# Patient Record
Sex: Female | Born: 1950 | Race: Black or African American | Hispanic: No | Marital: Married | Smoking: Former smoker
Health system: Southern US, Community
[De-identification: ages and names within clinical notes are randomized; demographics above are authoritative.]

## PROBLEM LIST (undated history)

## (undated) DIAGNOSIS — I10 Essential (primary) hypertension: Secondary | ICD-10-CM

## (undated) DIAGNOSIS — E119 Type 2 diabetes mellitus without complications: Secondary | ICD-10-CM

## (undated) DIAGNOSIS — K219 Gastro-esophageal reflux disease without esophagitis: Secondary | ICD-10-CM

## (undated) DIAGNOSIS — J45909 Unspecified asthma, uncomplicated: Secondary | ICD-10-CM

## (undated) DIAGNOSIS — E7800 Pure hypercholesterolemia, unspecified: Secondary | ICD-10-CM

## (undated) HISTORY — PX: OVARY SURGERY: SHX727

## (undated) HISTORY — PX: DILATION AND CURETTAGE OF UTERUS: SHX78

## (undated) HISTORY — DX: Pure hypercholesterolemia, unspecified: E78.00

---

## 2007-06-03 ENCOUNTER — Emergency Department (HOSPITAL_COMMUNITY): Admission: EM | Admit: 2007-06-03 | Discharge: 2007-06-03 | Payer: Self-pay | Admitting: Emergency Medicine

## 2013-05-31 ENCOUNTER — Other Ambulatory Visit (HOSPITAL_COMMUNITY): Payer: Self-pay | Admitting: Family Medicine

## 2013-05-31 DIAGNOSIS — Z139 Encounter for screening, unspecified: Secondary | ICD-10-CM

## 2013-05-31 DIAGNOSIS — M810 Age-related osteoporosis without current pathological fracture: Secondary | ICD-10-CM

## 2013-06-01 ENCOUNTER — Ambulatory Visit (HOSPITAL_COMMUNITY)
Admission: RE | Admit: 2013-06-01 | Discharge: 2013-06-01 | Disposition: A | Discharge status: Home / Self Care | Payer: BC Managed Care – PPO | Source: Ambulatory Visit | Attending: Family Medicine | Admitting: Family Medicine

## 2013-06-01 DIAGNOSIS — Z139 Encounter for screening, unspecified: Secondary | ICD-10-CM

## 2013-06-01 DIAGNOSIS — Z1231 Encounter for screening mammogram for malignant neoplasm of breast: Secondary | ICD-10-CM | POA: Insufficient documentation

## 2013-06-03 ENCOUNTER — Other Ambulatory Visit: Payer: Self-pay | Admitting: Family Medicine

## 2013-06-03 DIAGNOSIS — R928 Other abnormal and inconclusive findings on diagnostic imaging of breast: Secondary | ICD-10-CM

## 2013-06-08 ENCOUNTER — Ambulatory Visit (HOSPITAL_COMMUNITY)
Admission: RE | Admit: 2013-06-08 | Discharge: 2013-06-08 | Disposition: A | Discharge status: Home / Self Care | Payer: BC Managed Care – PPO | Source: Ambulatory Visit | Attending: Family Medicine | Admitting: Family Medicine

## 2013-06-08 DIAGNOSIS — M810 Age-related osteoporosis without current pathological fracture: Secondary | ICD-10-CM | POA: Insufficient documentation

## 2013-06-23 ENCOUNTER — Ambulatory Visit (HOSPITAL_COMMUNITY)
Admission: RE | Admit: 2013-06-23 | Discharge: 2013-06-23 | Disposition: A | Discharge status: Home / Self Care | Payer: BC Managed Care – PPO | Source: Ambulatory Visit | Attending: Family Medicine | Admitting: Family Medicine

## 2013-06-23 ENCOUNTER — Other Ambulatory Visit: Payer: Self-pay | Admitting: Family Medicine

## 2013-06-23 DIAGNOSIS — R928 Other abnormal and inconclusive findings on diagnostic imaging of breast: Secondary | ICD-10-CM | POA: Insufficient documentation

## 2014-02-12 ENCOUNTER — Encounter (HOSPITAL_COMMUNITY): Payer: Self-pay | Admitting: Emergency Medicine

## 2014-02-12 ENCOUNTER — Emergency Department (HOSPITAL_COMMUNITY): Payer: BC Managed Care – PPO

## 2014-02-12 ENCOUNTER — Inpatient Hospital Stay (HOSPITAL_COMMUNITY)
Admission: EM | Admit: 2014-02-12 | Discharge: 2014-02-16 | DRG: 638 | Disposition: A | Discharge status: Home / Self Care | Payer: BC Managed Care – PPO | Attending: Family Medicine | Admitting: Family Medicine

## 2014-02-12 DIAGNOSIS — E119 Type 2 diabetes mellitus without complications: Secondary | ICD-10-CM

## 2014-02-12 DIAGNOSIS — E876 Hypokalemia: Secondary | ICD-10-CM | POA: Diagnosis present

## 2014-02-12 DIAGNOSIS — Z87891 Personal history of nicotine dependence: Secondary | ICD-10-CM

## 2014-02-12 DIAGNOSIS — B370 Candidal stomatitis: Secondary | ICD-10-CM | POA: Diagnosis present

## 2014-02-12 DIAGNOSIS — Z794 Long term (current) use of insulin: Secondary | ICD-10-CM | POA: Diagnosis not present

## 2014-02-12 DIAGNOSIS — E1310 Other specified diabetes mellitus with ketoacidosis without coma: Secondary | ICD-10-CM | POA: Diagnosis not present

## 2014-02-12 DIAGNOSIS — K219 Gastro-esophageal reflux disease without esophagitis: Secondary | ICD-10-CM | POA: Diagnosis present

## 2014-02-12 DIAGNOSIS — E1110 Type 2 diabetes mellitus with ketoacidosis without coma: Secondary | ICD-10-CM | POA: Diagnosis present

## 2014-02-12 DIAGNOSIS — R531 Weakness: Secondary | ICD-10-CM | POA: Diagnosis not present

## 2014-02-12 DIAGNOSIS — E118 Type 2 diabetes mellitus with unspecified complications: Secondary | ICD-10-CM

## 2014-02-12 HISTORY — DX: Gastro-esophageal reflux disease without esophagitis: K21.9

## 2014-02-12 LAB — BASIC METABOLIC PANEL
Anion gap: 18 — ABNORMAL HIGH (ref 5–15)
Anion gap: 11 (ref 5–15)
BUN: 22 mg/dL (ref 6–23)
BUN: 19 mg/dL (ref 6–23)
BUN: 17 mg/dL (ref 6–23)
CALCIUM: 8.1 mg/dL — AB (ref 8.4–10.5)
CHLORIDE: 93 meq/L — AB (ref 96–112)
CHLORIDE: 102 meq/L (ref 96–112)
CO2: 13 mmol/L — ABNORMAL LOW (ref 19–32)
CO2: 12 mmol/L — ABNORMAL LOW (ref 19–32)
CO2: 16 mmol/L — ABNORMAL LOW (ref 19–32)
CREATININE: 1.27 mg/dL — AB (ref 0.50–1.10)
Calcium: 9.4 mg/dL (ref 8.4–10.5)
Calcium: 8.7 mg/dL (ref 8.4–10.5)
Chloride: 108 mEq/L (ref 96–112)
Creatinine, Ser: 1.09 mg/dL (ref 0.50–1.10)
Creatinine, Ser: 0.98 mg/dL (ref 0.50–1.10)
GFR calc Af Amer: 62 mL/min — ABNORMAL LOW (ref >90)
GFR calc Af Amer: 70 mL/min — ABNORMAL LOW (ref >90)
GFR calc non Af Amer: 53 mL/min — ABNORMAL LOW (ref >90)
GFR calc non Af Amer: 61 mL/min — ABNORMAL LOW (ref >90)
GFR, EST AFRICAN AMERICAN: 51 mL/min — AB (ref >90)
GFR, EST NON AFRICAN AMERICAN: 44 mL/min — AB (ref >90)
GLUCOSE: 262 mg/dL — AB (ref 70–99)
Glucose, Bld: 564 mg/dL (ref 70–99)
Glucose, Bld: 411 mg/dL — ABNORMAL HIGH (ref 70–99)
POTASSIUM: 4.5 mmol/L (ref 3.5–5.1)
POTASSIUM: 3.7 mmol/L (ref 3.5–5.1)
Potassium: 4.3 mmol/L (ref 3.5–5.1)
Sodium: 128 mmol/L — ABNORMAL LOW (ref 135–145)
Sodium: 132 mmol/L — ABNORMAL LOW (ref 135–145)
Sodium: 135 mmol/L (ref 135–145)

## 2014-02-12 LAB — CBC
HCT: 45.3 % (ref 36.0–46.0)
HCT: 42.8 % (ref 36.0–46.0)
Hemoglobin: 15.6 g/dL — ABNORMAL HIGH (ref 12.0–15.0)
Hemoglobin: 14.6 g/dL (ref 12.0–15.0)
MCH: 29.5 pg (ref 26.0–34.0)
MCH: 29.2 pg (ref 26.0–34.0)
MCHC: 34.4 g/dL (ref 30.0–36.0)
MCHC: 34.1 g/dL (ref 30.0–36.0)
MCV: 85.8 fL (ref 78.0–100.0)
MCV: 85.6 fL (ref 78.0–100.0)
PLATELETS: 169 10*3/uL (ref 150–400)
Platelets: 159 10*3/uL (ref 150–400)
RBC: 5.28 MIL/uL — ABNORMAL HIGH (ref 3.87–5.11)
RBC: 5 MIL/uL (ref 3.87–5.11)
RDW: 12.8 % (ref 11.5–15.5)
RDW: 12.8 % (ref 11.5–15.5)
WBC: 7.7 10*3/uL (ref 4.0–10.5)
WBC: 7.1 10*3/uL (ref 4.0–10.5)

## 2014-02-12 LAB — TROPONIN I
Troponin I: <0.03 ng/mL (ref <0.031)
Troponin I: <0.03 ng/mL (ref <0.031)

## 2014-02-12 LAB — CBG MONITORING, ED
GLUCOSE-CAPILLARY: 490 mg/dL — AB (ref 70–99)
Glucose-Capillary: >600 mg/dL (ref 70–99)
Glucose-Capillary: 421 mg/dL — ABNORMAL HIGH (ref 70–99)
Glucose-Capillary: 420 mg/dL — ABNORMAL HIGH (ref 70–99)

## 2014-02-12 LAB — GLUCOSE, CAPILLARY
GLUCOSE-CAPILLARY: 297 mg/dL — AB (ref 70–99)
GLUCOSE-CAPILLARY: 199 mg/dL — AB (ref 70–99)
GLUCOSE-CAPILLARY: 161 mg/dL — AB (ref 70–99)
Glucose-Capillary: 372 mg/dL — ABNORMAL HIGH (ref 70–99)
Glucose-Capillary: 320 mg/dL — ABNORMAL HIGH (ref 70–99)
Glucose-Capillary: 199 mg/dL — ABNORMAL HIGH (ref 70–99)
Glucose-Capillary: 190 mg/dL — ABNORMAL HIGH (ref 70–99)

## 2014-02-12 LAB — URINALYSIS, ROUTINE W REFLEX MICROSCOPIC
BILIRUBIN URINE: NEGATIVE
Glucose, UA: >1000 mg/dL — AB
NITRITE: NEGATIVE
PH: 5 (ref 5.0–8.0)
Protein, ur: NEGATIVE mg/dL
Specific Gravity, Urine: >1.03 — ABNORMAL HIGH (ref 1.005–1.030)
UROBILINOGEN UA: 0.2 mg/dL (ref 0.0–1.0)

## 2014-02-12 LAB — HEPATIC FUNCTION PANEL
ALBUMIN: 3.9 g/dL (ref 3.5–5.2)
ALT: 22 U/L (ref 0–35)
AST: 14 U/L (ref 0–37)
Alkaline Phosphatase: 100 U/L (ref 39–117)
BILIRUBIN DIRECT: 0.1 mg/dL (ref 0.0–0.3)
Indirect Bilirubin: 1.2 mg/dL — ABNORMAL HIGH (ref 0.3–0.9)
Total Bilirubin: 1.3 mg/dL — ABNORMAL HIGH (ref 0.3–1.2)
Total Protein: 7.9 g/dL (ref 6.0–8.3)

## 2014-02-12 LAB — URINE MICROSCOPIC-ADD ON

## 2014-02-12 LAB — MRSA PCR SCREENING: MRSA BY PCR: NEGATIVE

## 2014-02-12 LAB — LIPASE, BLOOD: LIPASE: 33 U/L (ref 11–59)

## 2014-02-12 MED ORDER — POTASSIUM CHLORIDE 10 MEQ/100ML IV SOLN
10.0000 meq | INTRAVENOUS | Status: AC
  Administered 2014-02-12 (×2): 10 meq via INTRAVENOUS
  Filled 2014-02-12 (×2): qty 100

## 2014-02-12 MED ORDER — SODIUM CHLORIDE 0.9 % IV SOLN
INTRAVENOUS | Status: DC
  Filled 2014-02-12: qty 2.5

## 2014-02-12 MED ORDER — SODIUM CHLORIDE 0.9 % IV SOLN: INTRAVENOUS | Status: AC

## 2014-02-12 MED ORDER — SODIUM CHLORIDE 0.9 % IV SOLN
1000.0000 mL | Freq: Once | INTRAVENOUS | Status: AC
  Administered 2014-02-12: 1000 mL via INTRAVENOUS

## 2014-02-12 MED ORDER — SODIUM CHLORIDE 0.9 % IV SOLN: 1000.0000 mL | INTRAVENOUS | Status: DC

## 2014-02-12 MED ORDER — PANTOPRAZOLE SODIUM 40 MG PO TBEC
40.0000 mg | DELAYED_RELEASE_TABLET | Freq: Every day | ORAL | Status: DC
  Administered 2014-02-12 – 2014-02-16 (×5): 40 mg via ORAL
  Filled 2014-02-12 (×5): qty 1

## 2014-02-12 MED ORDER — MAGIC MOUTHWASH
10.0000 mL | Freq: Three times a day (TID) | ORAL | Status: DC
  Administered 2014-02-12 – 2014-02-16 (×11): 10 mL via ORAL
  Filled 2014-02-12 (×12): qty 10

## 2014-02-12 MED ORDER — DEXTROSE-NACL 5-0.45 % IV SOLN
INTRAVENOUS | Status: DC
  Administered 2014-02-12: 19:00:00 via INTRAVENOUS

## 2014-02-12 MED ORDER — SODIUM CHLORIDE 0.9 % IV SOLN
INTRAVENOUS | Status: DC
  Administered 2014-02-12 (×2): via INTRAVENOUS

## 2014-02-12 MED ORDER — ENOXAPARIN SODIUM 40 MG/0.4ML ~~LOC~~ SOLN
40.0000 mg | SUBCUTANEOUS | Status: DC
  Administered 2014-02-12: 40 mg via SUBCUTANEOUS
  Filled 2014-02-12: qty 0.4

## 2014-02-12 MED ORDER — SODIUM CHLORIDE 0.9 % IV SOLN
INTRAVENOUS | Status: DC
  Administered 2014-02-12: 13:00:00 via INTRAVENOUS
  Filled 2014-02-12: qty 2.5

## 2014-02-12 NOTE — ED Notes (Signed)
CRITICAL VALUE ALERT  Critical value received:  Glucose 564  Date of notification:  02/02/14  Time of notification:  0881  Critical value read back:Yes.    Nurse who received alert:  Allegra Lai, RN  MD notified (1st page):  Dr Eulis Foster  Time of first page:  1020

## 2014-02-12 NOTE — ED Notes (Addendum)
Pt reports genealized weakness that has been increasingly worse, pt also reports going to her PCP earlier this week c/o hiccups and was told she has GERD and thrush. Pt reports decreased appetite but states she is still eating.

## 2014-02-12 NOTE — ED Provider Notes (Signed)
CSN: [id number]     Arrival date & time 02/12/14  3762 History   First MD Initiated Contact with Patient 02/12/14 1010     Chief Complaint  Patient presents with  . Weakness     (Consider location/radiation/quality/duration/timing/severity/associated sxs/prior Treatment) Patient is a 63 y.o. female presenting with weakness.  Weakness Associated symptoms include fatigue, headaches and nausea. Pertinent negatives include no abdominal pain, arthralgias, chest pain, chills, coughing, diaphoresis, fever, myalgias, neck pain, numbness, rash, sore throat, vomiting or weakness.    Sherry Burgess is a 63 y.o. female who presents to the Emergency Department complaining of gradual onset of generalized weakness, nausea, fatigue for 2 weeks.  She also reports having increased urination and increased thirst. Patient's husband reports decreased appetite and weight loss. Patient states she was seen by her PCP on 1220 and was given medication for thrush and acid reflux. She states these medications have not improved her symptoms. She states that she is usually a healthy very active person and she is now unable to continue her normal activities due to fatigue and weakness.  She denies any pain, fever, shortness of breath, extremity numbness or weakness or abdominal pain. Patient states that her son is a diabetic but she denies any family history of diabetes.  Nothing has made her symptoms better or worse   Past Medical History  Diagnosis Date  . GERD (gastroesophageal reflux disease)    History reviewed. No pertinent past surgical history. No family history on file. History  Substance Use Topics  . Smoking status: Former Research scientist (life sciences)  . Smokeless tobacco: Not on file  . Alcohol Use: No   OB History    No data available     Review of Systems  Constitutional: Positive for activity change, appetite change and fatigue. Negative for fever, chills and diaphoresis.  HENT: Negative for facial swelling, sore  throat and trouble swallowing.   Eyes: Negative for visual disturbance.  Respiratory: Negative for cough, chest tightness, shortness of breath and wheezing.   Cardiovascular: Negative for chest pain and palpitations.  Gastrointestinal: Positive for nausea. Negative for vomiting, abdominal pain and blood in stool.  Endocrine: Positive for polydipsia and polyuria.  Genitourinary: Negative for dysuria, hematuria and flank pain.  Musculoskeletal: Negative for myalgias, back pain, arthralgias, neck pain and neck stiffness.  Skin: Negative for rash.  Neurological: Positive for light-headedness and headaches. Negative for dizziness, syncope, speech difficulty, weakness and numbness.       Generalized weakness  Hematological: Does not bruise/bleed easily.  All other systems reviewed and are negative.     Allergies  Review of patient's allergies indicates no known allergies.  Home Medications   Prior to Admission medications   Medication Sig Start Date End Date Taking? Authorizing Provider  Alum & Mag Hydroxide-Simeth (MAGIC MOUTHWASH) SOLN Swish and spit 15-30 mLs 4 (four) times daily.   Yes Historical Provider, MD  ondansetron (ZOFRAN) 4 MG tablet Take 4 mg by mouth every 8 (eight) hours as needed for nausea or vomiting.   Yes Historical Provider, MD  pantoprazole (PROTONIX) 40 MG tablet Take 40 mg by mouth 2 (two) times daily.   Yes Historical Provider, MD   BP 183/102 mmHg  Pulse 96  Temp(Src) 98.2 F (36.8 C) (Oral)  Resp 18  Ht 5\' 7"  (1.702 m)  Wt 230 lb (104.327 kg)  BMI 36.01 kg/m2  SpO2 100% Physical Exam  Constitutional: She is oriented to person, place, and time. She appears well-developed and well-nourished. No distress.  HENT:  Head: Normocephalic and atraumatic.  Right Ear: Tympanic membrane and ear canal normal.  Left Ear: Tympanic membrane and ear canal normal.  Mouth/Throat: Uvula is midline. Mucous membranes are dry. Oral lesions present.  Few scattered white  lesions present to the soft palate and buccal mucosa  Eyes: Conjunctivae are normal. Pupils are equal, round, and reactive to light. No scleral icterus.  Neck: Normal range of motion. Neck supple.  Cardiovascular: Normal rate, regular rhythm, normal heart sounds and intact distal pulses.  Exam reveals no friction rub.   No murmur heard. Pulmonary/Chest: Effort normal and breath sounds normal. No respiratory distress. She has no wheezes. She exhibits no tenderness.  Abdominal: Soft. She exhibits no distension and no mass. There is no tenderness. There is no rebound and no guarding.  Musculoskeletal: Normal range of motion.  Neurological: She is alert and oriented to person, place, and time. She exhibits normal muscle tone. Coordination normal.  Skin: Skin is warm and dry. No rash noted.  Psychiatric: She has a normal mood and affect. Her behavior is normal. Thought content normal.  Nursing note and vitals reviewed.   ED Course  Procedures (including critical care time) Labs Review Labs Reviewed  CBC - Abnormal; Notable for the following:    RBC 5.28 (*)    Hemoglobin 15.6 (*)    All other components within normal limits  BASIC METABOLIC PANEL - Abnormal; Notable for the following:    Sodium 128 (*)    Chloride 93 (*)    CO2 13 (*)    Glucose, Bld 564 (*)    Creatinine, Ser 1.27 (*)    GFR calc non Af Amer 44 (*)    GFR calc Af Amer 51 (*)    All other components within normal limits  URINALYSIS, ROUTINE W REFLEX MICROSCOPIC - Abnormal; Notable for the following:    APPearance HAZY (*)    Specific Gravity, Urine >1.030 (*)    Glucose, UA >1000 (*)    Hgb urine dipstick MODERATE (*)    Ketones, ur >80 (*)    Leukocytes, UA TRACE (*)    All other components within normal limits  URINE MICROSCOPIC-ADD ON - Abnormal; Notable for the following:    Bacteria, UA MANY (*)    Casts GRANULAR CAST (*)    All other components within normal limits  CBG MONITORING, ED - Abnormal; Notable  for the following:    Glucose-Capillary >600 (*)    All other components within normal limits  CBG MONITORING, ED - Abnormal; Notable for the following:    Glucose-Capillary 490 (*)    All other components within normal limits  CBG MONITORING, ED - Abnormal; Notable for the following:    Glucose-Capillary 421 (*)    All other components within normal limits    Imaging Review Dg Chest Portable 1 View  02/12/2014   CLINICAL DATA:  Weakness and hyperglycemia.  EXAM: PORTABLE CHEST - 1 VIEW  COMPARISON:  06/03/2007  FINDINGS: Lungs are adequately inflated without consolidation or effusion. The cardiomediastinal silhouette and remainder of the exam is unchanged.  IMPRESSION: No active disease.   Electronically Signed   By: Marin Olp M.D.   On: 02/12/2014 11:19     Date: 02/12/2014  Rate: 88  Rhythm: normal sinus rhythm  QRS Axis: indeterminate  Intervals:   ST/T Wave abnormalities:   Conduction Disutrbances:none  Narrative Interpretation:   Old EKG Reviewed: none available  EKG read by Dr. Eulis Foster.  Rhythm normal  sinus, with baseline wander, small amt of artifact  MDM   Final diagnoses:  Diabetes mellitus, new onset  Diabetic ketoacidosis without coma associated with other specified diabetes mellitus   Pt is receiving IVF's and insulin, she remains hemodynamically stable and reports that she is feeling better after treatment.  A. gap is 22, DKA with new onset DM.     33  Consulted Dr. Roderic Palau.  Will admit pt to step down, Dr. Everette Rank service     Asuka Dusseau L. Vanessa [place], PA-C 02/12/14 [place], MD 02/12/14 [phone]

## 2014-02-12 NOTE — H&P (Signed)
[facility] History and Physical  Sherry Burgess:[id number] DOB: February 21, 1950 DOA: 02/12/2014  Referring physician: Emergency department PCP: Lanette Hampshire, MD   Chief Complaint: Generalized weakness  HPI: Sherry Burgess is a 63 y.o. female who presents to the emergency room with complaints of generalized weakness. She had seen her primary care physician earlier this week and reported repeated belching/2 cups. It was felt that she likely has GERD and she was started on a proton pump inhibitor. Further questioning of the patient today indicated that she's been having general weakness for the last several days. She's had associated polyuria, polydipsia. She has not had any vomiting, abdominal cramps, diarrhea, dysuria, fever, shortness of breath or any other complaints. She was evaluated in the emergency room were blood sugar noted to be elevated greater than 600 and she was found to be in diabetic ketoacidosis with an anion gap of 26. She is being admitted for further treatments.   Review of Systems:  Pertinent positives as per history of present illness, otherwise negative  Past Medical History  Diagnosis Date  . GERD (gastroesophageal reflux disease)    History reviewed. No pertinent past surgical history. Social History:  reports that she has quit smoking. She does not have any smokeless tobacco history on file. She reports that she does not drink alcohol. Her drug history is not on file.  No Known Allergies  Family history: Her son also has diabetes   Prior to Admission medications   Medication Sig Start Date End Date Taking? Authorizing Provider  Alum & Mag Hydroxide-Simeth (MAGIC MOUTHWASH) SOLN Swish and spit 15-30 mLs 4 (four) times daily.   Yes Historical Provider, MD  ondansetron (ZOFRAN) 4 MG tablet Take 4 mg by mouth every 8 (eight) hours as needed for nausea or vomiting.   Yes Historical Provider, MD  pantoprazole (PROTONIX) 40 MG tablet Take 40 mg by mouth 2  (two) times daily.   Yes Historical Provider, MD   Physical Exam: Filed Vitals:   02/12/14 0903 02/12/14 1317 02/12/14 1330  BP: 183/102 139/73 127/77  Pulse: 96 96 97  Temp: 98.2 F (36.8 C)    TempSrc: Oral    Resp: 18 20 24   Height: 5\' 7"  (1.702 m)    Weight: 104.327 kg (230 lb)    SpO2: 100% 100% 100%    Wt Readings from Last 3 Encounters:  02/12/14 104.327 kg (230 lb)    General:  Appears calm and comfortable Eyes: PERRL, normal lids, irises & conjunctiva ENT: Mucous membranes are dry, thrush is present in oral cavity Neck: no LAD, masses or thyromegaly Cardiovascular: RRR, no m/r/g. No LE edema. Telemetry: SR, no arrhythmias  Respiratory: CTA bilaterally, no w/r/r. Normal respiratory effort. Abdomen: soft, ntnd Skin: no rash or induration seen on limited exam Musculoskeletal: grossly normal tone BUE/BLE Psychiatric: grossly normal mood and affect, speech fluent and appropriate Neurologic: grossly non-focal.          Labs on Admission:  Basic Metabolic Panel:  Recent Labs Lab 02/12/14 0945  NA 128*  K 4.5  CL 93*  CO2 13*  GLUCOSE 564*  BUN 22  CREATININE 1.27*  CALCIUM 9.4   Liver Function Tests: No results for input(s): AST, ALT, ALKPHOS, BILITOT, PROT, ALBUMIN in the last 168 hours. No results for input(s): LIPASE, AMYLASE in the last 168 hours. No results for input(s): AMMONIA in the last 168 hours. CBC:  Recent Labs Lab 02/12/14 0945  WBC 7.7  HGB 15.6*  HCT 45.3  MCV 85.8  PLT 169   Cardiac Enzymes: No results for input(s): CKTOTAL, CKMB, CKMBINDEX, TROPONINI in the last 168 hours.  BNP (last 3 results) No results for input(s): PROBNP in the last 8760 hours. CBG:  Recent Labs Lab 02/12/14 0913 02/12/14 1202 02/12/14 1308  GLUCAP >600* 490* 421*    Radiological Exams on Admission: Dg Chest Portable 1 View  02/12/2014   CLINICAL DATA:  Weakness and hyperglycemia.  EXAM: PORTABLE CHEST - 1 VIEW  COMPARISON:  06/03/2007   FINDINGS: Lungs are adequately inflated without consolidation or effusion. The cardiomediastinal silhouette and remainder of the exam is unchanged.  IMPRESSION: No active disease.   Electronically Signed   By: Marin Olp M.D.   On: 02/12/2014 11:19    EKG: Not available for my review. This has been reordered  Assessment/Plan Active Problems:   DKA (diabetic ketoacidoses)   Thrush, oral   Diabetes mellitus   1. Diabetic ketoacidosis. Patient will be admitted to the stepdown unit. She'll receive intravenous insulin infusion per DKA protocol. Continue aggressive hydration. Once her anion gap has closed, she can be transitioned to basal/bolus insulin. Will check hemoglobin A1c. Check LFTs, lipase, cardiac enzymes, EKG to rule out any other etiology of diabetic ketoacidosis. Diabetes coordinator consult. The patient will need diabetic teaching since she is a new diabetic. 2. Oral thrush. In the setting of diabetes. Continue Magic mouthwash. 3. New onset diabetes mellitus. Treatment as above.   Code Status: full code DVT Prophylaxis: lovenox Family Communication: discussed with patient and husband at the bedside Disposition Plan: discharge home once improved  Time spent: 61mins  MEMON,JEHANZEB [facility] Pager [phone]

## 2014-02-12 NOTE — Progress Notes (Deleted)
Patient unable to void, since foley removed at 1130. Bladder scanned, showed 619 cc. Dr Roderic Palau notified by phone. Order received to place foley.

## 2014-02-13 LAB — GLUCOSE, CAPILLARY
GLUCOSE-CAPILLARY: 112 mg/dL — AB (ref 70–99)
GLUCOSE-CAPILLARY: 113 mg/dL — AB (ref 70–99)
GLUCOSE-CAPILLARY: 119 mg/dL — AB (ref 70–99)
GLUCOSE-CAPILLARY: 143 mg/dL — AB (ref 70–99)
GLUCOSE-CAPILLARY: 182 mg/dL — AB (ref 70–99)
Glucose-Capillary: 191 mg/dL — ABNORMAL HIGH (ref 70–99)
Glucose-Capillary: 143 mg/dL — ABNORMAL HIGH (ref 70–99)
Glucose-Capillary: 103 mg/dL — ABNORMAL HIGH (ref 70–99)
Glucose-Capillary: 142 mg/dL — ABNORMAL HIGH (ref 70–99)
Glucose-Capillary: 120 mg/dL — ABNORMAL HIGH (ref 70–99)
Glucose-Capillary: 325 mg/dL — ABNORMAL HIGH (ref 70–99)
Glucose-Capillary: 350 mg/dL — ABNORMAL HIGH (ref 70–99)
Glucose-Capillary: 327 mg/dL — ABNORMAL HIGH (ref 70–99)

## 2014-02-13 LAB — BASIC METABOLIC PANEL
ANION GAP: 7 (ref 5–15)
Anion gap: 5 (ref 5–15)
Anion gap: 7 (ref 5–15)
BUN: 15 mg/dL (ref 6–23)
BUN: 13 mg/dL (ref 6–23)
BUN: 12 mg/dL (ref 6–23)
CALCIUM: 8.2 mg/dL — AB (ref 8.4–10.5)
CHLORIDE: 107 meq/L (ref 96–112)
CO2: 22 mmol/L (ref 19–32)
CO2: 20 mmol/L (ref 19–32)
CO2: 21 mmol/L (ref 19–32)
CREATININE: 0.81 mg/dL (ref 0.50–1.10)
Calcium: 8.3 mg/dL — ABNORMAL LOW (ref 8.4–10.5)
Calcium: 8.3 mg/dL — ABNORMAL LOW (ref 8.4–10.5)
Chloride: 108 mEq/L (ref 96–112)
Chloride: 103 mEq/L (ref 96–112)
Creatinine, Ser: 0.8 mg/dL (ref 0.50–1.10)
Creatinine, Ser: 0.74 mg/dL (ref 0.50–1.10)
GFR calc Af Amer: 90 mL/min — ABNORMAL LOW (ref >90)
GFR calc Af Amer: >90 mL/min (ref >90)
GFR calc Af Amer: 88 mL/min — ABNORMAL LOW (ref >90)
GFR, EST NON AFRICAN AMERICAN: 77 mL/min — AB (ref >90)
GFR, EST NON AFRICAN AMERICAN: 89 mL/min — AB (ref >90)
GFR, EST NON AFRICAN AMERICAN: 76 mL/min — AB (ref >90)
GLUCOSE: 187 mg/dL — AB (ref 70–99)
GLUCOSE: 156 mg/dL — AB (ref 70–99)
GLUCOSE: 310 mg/dL — AB (ref 70–99)
POTASSIUM: 3.6 mmol/L (ref 3.5–5.1)
POTASSIUM: 2.9 mmol/L — AB (ref 3.5–5.1)
Potassium: 3.7 mmol/L (ref 3.5–5.1)
Sodium: 135 mmol/L (ref 135–145)
Sodium: 134 mmol/L — ABNORMAL LOW (ref 135–145)
Sodium: 131 mmol/L — ABNORMAL LOW (ref 135–145)

## 2014-02-13 LAB — TROPONIN I
Troponin I: <0.03 ng/mL (ref <0.031)
Troponin I: <0.03 ng/mL (ref <0.031)

## 2014-02-13 LAB — HEMOGLOBIN A1C
Hgb A1c MFr Bld: 14.1 % — ABNORMAL HIGH (ref <5.7)
MEAN PLASMA GLUCOSE: 358 mg/dL — AB (ref <117)

## 2014-02-13 MED ORDER — INSULIN ASPART 100 UNIT/ML ~~LOC~~ SOLN
0.0000 [IU] | Freq: Three times a day (TID) | SUBCUTANEOUS | Status: DC
  Administered 2014-02-13 – 2014-02-14 (×3): 11 [IU] via SUBCUTANEOUS

## 2014-02-13 MED ORDER — ENOXAPARIN SODIUM 40 MG/0.4ML ~~LOC~~ SOLN
40.0000 mg | SUBCUTANEOUS | Status: DC
  Administered 2014-02-13 – 2014-02-16 (×4): 40 mg via SUBCUTANEOUS
  Filled 2014-02-13 (×4): qty 0.4

## 2014-02-13 MED ORDER — SODIUM CHLORIDE 0.9 % IV SOLN
INTRAVENOUS | Status: DC
Start: 2014-02-13 — End: 2014-02-16
  Administered 2014-02-13 – 2014-02-15 (×5): via INTRAVENOUS

## 2014-02-13 MED ORDER — INSULIN ASPART 100 UNIT/ML ~~LOC~~ SOLN
0.0000 [IU] | Freq: Every day | SUBCUTANEOUS | Status: DC
  Administered 2014-02-14: 4 [IU] via SUBCUTANEOUS

## 2014-02-13 MED ORDER — LIVING WELL WITH DIABETES BOOK
Freq: Once | Status: DC
  Filled 2014-02-13: qty 1

## 2014-02-13 MED ORDER — POTASSIUM CHLORIDE 10 MEQ/100ML IV SOLN
10.0000 meq | INTRAVENOUS | Status: AC
  Administered 2014-02-13 (×3): 10 meq via INTRAVENOUS
  Filled 2014-02-13 (×3): qty 100

## 2014-02-13 MED ORDER — POTASSIUM CHLORIDE CRYS ER 20 MEQ PO TBCR
40.0000 meq | EXTENDED_RELEASE_TABLET | Freq: Once | ORAL | Status: AC
  Administered 2014-02-13: 40 meq via ORAL
  Filled 2014-02-13: qty 2

## 2014-02-13 NOTE — Progress Notes (Signed)
Subjective: The patient is alert and oriented this morning. She was admitted with diabetic ketoacidosis with blood sugars greater than 600.  Objective: Vital signs in last 24 hours: Temp:  [97.5 F (36.4 C)-98.3 F (36.8 C)] 98.3 F (36.8 C) (12/27 0400) Pulse Rate:  [72-97] 72 (12/27 0700) Resp:  [13-24] 14 (12/27 0700) BP: (115-183)/(59-128) 148/75 mmHg (12/27 0700) SpO2:  [98 %-100 %] 100 % (12/27 0700) Weight:  [104 kg (229 lb 4.5 oz)-104.327 kg (230 lb)] 104 kg (229 lb 4.5 oz) (12/27 0500) Weight change:     Intake/Output from previous day: 12/26 0701 - 12/27 0700 In: 1616.6 [I.V.:1416.6; IV Piggyback:200] Out: -  Intake/Output this shift:    Physical Exam: General appearance patient is alert and oriented  HEENT negative  Neck supple no JVD or thyroid abnormalities  Lungs clear to P&A  Heart regular rhythm no murmurs  Abdomen soft no palpable organs or masses  Skin warm and dry   Recent Labs  02/12/14 0945 02/12/14 1400  WBC 7.7 7.1  HGB 15.6* 14.6  HCT 45.3 42.8  PLT 169 159   BMET  Recent Labs  02/12/14 2250 02/13/14 0153  NA 135 134*  K 3.6 2.9*  CL 108 107  CO2 22 20  GLUCOSE 187* 156*  BUN 15 13  CREATININE 0.80 0.74  CALCIUM 8.3* 8.3*    Studies/Results: Dg Chest Portable 1 View  02/12/2014   CLINICAL DATA:  Weakness and hyperglycemia.  EXAM: PORTABLE CHEST - 1 VIEW  COMPARISON:  06/03/2007  FINDINGS: Lungs are adequately inflated without consolidation or effusion. The cardiomediastinal silhouette and remainder of the exam is unchanged.  IMPRESSION: No active disease.   Electronically Signed   By: Marin Olp M.D.   On: 02/12/2014 11:19    Medications:  . enoxaparin (LOVENOX) injection  40 mg Subcutaneous Q24H  . magic mouthwash  10 mL Oral TID  . pantoprazole  40 mg Oral Daily    . sodium chloride 125 mL/hr at 02/12/14 1851  . dextrose 5 % and 0.45% NaCl 125 mL/hr at 02/13/14 0300  . insulin (NOVOLIN-R) infusion 4.2 Units/hr  (02/13/14 0300)     Assessment/Plan: New-onset diabetes with ketoacidosis-plan at this point to continue D5 half-normal saline-start sliding scale insulin and Accu-Cheks before meals and at bedtime. Also because of low serum potassium patient will receive runs of potassium and by mouth potassium. We will recheck chemistries and continue to monitor.   LOS: 1 day   Rissie Sculley G 02/13/2014, 7:29 AM

## 2014-02-13 NOTE — Progress Notes (Signed)
Pt educated in regards to carb counting and use of the patient education videos. She is currently watching the education material, and any questions will be addressed.  Importance of foot care was also relayed.

## 2014-02-14 LAB — GLUCOSE, CAPILLARY
GLUCOSE-CAPILLARY: 320 mg/dL — AB (ref 70–99)
GLUCOSE-CAPILLARY: 392 mg/dL — AB (ref 70–99)
Glucose-Capillary: 255 mg/dL — ABNORMAL HIGH (ref 70–99)
Glucose-Capillary: 281 mg/dL — ABNORMAL HIGH (ref 70–99)

## 2014-02-14 LAB — BASIC METABOLIC PANEL
ANION GAP: 8 (ref 5–15)
BUN: 8 mg/dL (ref 6–23)
CALCIUM: 8.4 mg/dL (ref 8.4–10.5)
CO2: 22 mmol/L (ref 19–32)
Chloride: 101 mEq/L (ref 96–112)
Creatinine, Ser: 0.77 mg/dL (ref 0.50–1.10)
GFR calc Af Amer: >90 mL/min (ref >90)
GFR calc non Af Amer: 88 mL/min — ABNORMAL LOW (ref >90)
Glucose, Bld: 426 mg/dL — ABNORMAL HIGH (ref 70–99)
POTASSIUM: 3.1 mmol/L — AB (ref 3.5–5.1)
SODIUM: 131 mmol/L — AB (ref 135–145)

## 2014-02-14 LAB — TROPONIN I
Troponin I: <0.03 ng/mL (ref <0.031)
Troponin I: <0.03 ng/mL (ref <0.031)
Troponin I: <0.03 ng/mL (ref <0.031)

## 2014-02-14 MED ORDER — INSULIN STARTER KIT- SYRINGES (ENGLISH)
1.0000 | Freq: Once | Status: AC
  Administered 2014-02-14: 1
  Filled 2014-02-14: qty 1

## 2014-02-14 MED ORDER — INSULIN DETEMIR 100 UNIT/ML ~~LOC~~ SOLN
30.0000 [IU] | Freq: Every day | SUBCUTANEOUS | Status: DC
  Filled 2014-02-14: qty 0.3

## 2014-02-14 MED ORDER — POTASSIUM CHLORIDE 10 MEQ/100ML IV SOLN
10.0000 meq | INTRAVENOUS | Status: AC
  Administered 2014-02-14 (×3): 10 meq via INTRAVENOUS
  Filled 2014-02-14 (×3): qty 100

## 2014-02-14 MED ORDER — INSULIN ASPART 100 UNIT/ML ~~LOC~~ SOLN
0.0000 [IU] | Freq: Three times a day (TID) | SUBCUTANEOUS | Status: DC
  Administered 2014-02-14: 20 [IU] via SUBCUTANEOUS
  Administered 2014-02-14: 11 [IU] via SUBCUTANEOUS
  Administered 2014-02-15: 7 [IU] via SUBCUTANEOUS
  Administered 2014-02-15: 20 [IU] via SUBCUTANEOUS
  Administered 2014-02-15: 7 [IU] via SUBCUTANEOUS
  Administered 2014-02-16: 20 [IU] via SUBCUTANEOUS

## 2014-02-14 MED ORDER — INSULIN DETEMIR 100 UNIT/ML ~~LOC~~ SOLN
10.0000 [IU] | Freq: Once | SUBCUTANEOUS | Status: AC
  Administered 2014-02-14: 10 [IU] via SUBCUTANEOUS
  Filled 2014-02-14: qty 0.1

## 2014-02-14 MED ORDER — INSULIN ASPART 100 UNIT/ML ~~LOC~~ SOLN
5.0000 [IU] | Freq: Three times a day (TID) | SUBCUTANEOUS | Status: DC
  Administered 2014-02-14 (×2): 5 [IU] via SUBCUTANEOUS

## 2014-02-14 MED ORDER — INSULIN ASPART 100 UNIT/ML ~~LOC~~ SOLN
0.0000 [IU] | Freq: Every day | SUBCUTANEOUS | Status: DC
  Administered 2014-02-14 – 2014-02-15 (×2): 3 [IU] via SUBCUTANEOUS

## 2014-02-14 MED ORDER — INSULIN DETEMIR 100 UNIT/ML ~~LOC~~ SOLN
20.0000 [IU] | Freq: Every day | SUBCUTANEOUS | Status: DC
  Administered 2014-02-14: 20 [IU] via SUBCUTANEOUS
  Filled 2014-02-14: qty 0.2

## 2014-02-14 NOTE — Care Management Note (Signed)
    Page 1 of 1   02/14/2014     2:49:01 PM CARE MANAGEMENT NOTE 02/14/2014  Patient:  Sherry Burgess, Sherry Burgess   Account Number:  [id number]  Date Initiated:  02/14/2014  Documentation initiated by:  Jolene Provost  Subjective/Objective Assessment:   Pt is from home with husband and children. Pt admitted for DKA. Pt has no HH serivces, DME's or med needs prior to admission. Pt plans to discharge home with self care. No CM needs identified at this time.     Action/Plan:   Anticipated DC Date:  02/16/2014   Anticipated DC Plan:  [place]  CM consult      Choice offered to / List presented to:             Status of service:  Completed, signed off Medicare Important Message given?   (If response is "NO", the following Medicare IM given date fields will be blank) Date Medicare IM given:   Medicare IM given by:   Date Additional Medicare IM given:   Additional Medicare IM given by:    Discharge Disposition:  HOME/SELF CARE  Per UR Regulation:  Reviewed for med. necessity/level of care/duration of stay  If discussed at [place] of Stay Meetings, dates discussed:    Comments:  02/14/2014 [place], RN, MSN, [facility]

## 2014-02-14 NOTE — Progress Notes (Signed)
Inpatient Diabetes Program Recommendations  AACE/ADA: New Consensus Statement on Inpatient Glycemic Control (2013)  Target Ranges:  Prepandial:   less than 140 mg/dL      Peak postprandial:   less than 180 mg/dL (1-2 hours)      Critically ill patients:  140 - 180 mg/dL   Results for Sherry Burgess, Sherry Burgess (MRN [id number]) as of 02/14/2014 10:00  Ref. Range 02/13/2014 08:33 02/13/2014 11:21 02/13/2014 16:37 02/13/2014 21:24 02/14/2014 07:54  Glucose-Capillary Latest Range: 70-99 mg/dL 120 (H) 325 (H) 350 (H) 327 (H) 320 (H)   Diabetes history: Newly diagnosed this admission Outpatient Diabetes medications: NA Current orders for Inpatient glycemic control: Levemir 20 units daily, Novolog 0-15 units AC, Novolog 0-5 units HS  Inpatient Diabetes Program Recommendations Insulin - Basal: Noted patient was on an insulin drip and was not given any basal insulin at time of transition. As a result, fasting glucose up to 320 mg/dl this morning. Levemir 20 units was ordered this morning and has already been given. Please consider increasing Levemir to 30 units daily (based on 94 kg x 0.3 units).  Correction (SSI): Please consider increasing Novolog correction to resistant scale. Insulin - Meal Coverage: Please consider ordering Novolog 5 units TID with meals for meal coverage. HgbA1C: A1C 14.1% on 02/12/14.  Thanks, Barnie Alderman, RN, MSN, CCRN, CDE Diabetes Coordinator Inpatient Diabetes Program [phone] (Team Pager) [phone] ([facility] office) [phone] [facility] office)

## 2014-02-14 NOTE — Plan of Care (Signed)
Problem: Phase II Progression Outcomes Goal: CBGs stable on SQ insulin Outcome: Progressing CBGs in 300s today; Levemir started and meal coverage added    Goal: Progress activity as tolerated unless otherwise ordered Outcome: Progressing Pt up to chair; independently

## 2014-02-14 NOTE — Progress Notes (Signed)
Subjective: The patient is alert and oriented this morning. She was admitted with diabetic ketoacidosis.  Objective: Vital signs in last 24 hours: Temp:  [98.1 F (36.7 C)-98.5 F (36.9 C)] 98.3 F (36.8 C) (12/28 0400) Pulse Rate:  [72-95] 75 (12/28 0600) Resp:  [9-23] 13 (12/28 0600) BP: (92-149)/(57-102) 127/57 mmHg (12/28 0600) SpO2:  [95 %-100 %] 100 % (12/28 0600) Weight:  [94.9 kg (209 lb 3.5 oz)] 94.9 kg (209 lb 3.5 oz) (12/28 0500) Weight change: -9.427 kg (-20 lb 12.5 oz) Last BM Date: 02/09/14  Intake/Output from previous day: 12/27 0701 - 12/28 0700 In: 3260 [P.O.:960; I.V.:2000; IV Piggyback:300] Out: -  Intake/Output this shift:    Physical Exam: General appearance patient is alert and oriented  HEENT negative  Neck supple no JVD or thyroid abnormalities  Lungs clear to P&A  Heart regular rhythm no murmurs  Abdomen soft no palpable organs or masses  Skin warm and dry   Recent Labs  02/12/14 0945 02/12/14 1400  WBC 7.7 7.1  HGB 15.6* 14.6  HCT 45.3 42.8  PLT 169 159   BMET  Recent Labs  02/13/14 0153 02/13/14 1041  NA 134* 131*  K 2.9* 3.7  CL 107 103  CO2 20 21  GLUCOSE 156* 310*  BUN 13 12  CREATININE 0.74 0.81  CALCIUM 8.3* 8.2*    Studies/Results: Dg Chest Portable 1 View  02/12/2014   CLINICAL DATA:  Weakness and hyperglycemia.  EXAM: PORTABLE CHEST - 1 VIEW  COMPARISON:  06/03/2007  FINDINGS: Lungs are adequately inflated without consolidation or effusion. The cardiomediastinal silhouette and remainder of the exam is unchanged.  IMPRESSION: No active disease.   Electronically Signed   By: Marin Olp M.D.   On: 02/12/2014 11:19    Medications:  . enoxaparin (LOVENOX) injection  40 mg Subcutaneous Q24H  . insulin aspart  0-15 Units Subcutaneous TID WC  . insulin aspart  0-5 Units Subcutaneous QHS  . living well with diabetes book   Does not apply Once  . magic mouthwash  10 mL Oral TID  . pantoprazole  40 mg Oral Daily     . sodium chloride 125 mL/hr at 02/13/14 1900     Assessment/Plan: 1. New onset diabetes with diabetic ketoacidosis-patient is improving. Her chemistries have improved but she still has blood sugars in the 300 range. Plan to start long-acting insulin to continue sliding scale short-acting insulin. We will continue to check to check chemistries and continue to monitor sugars. She could move out of intensive care unit when bed is available. We will continue diabetic teaching   LOS: 2 days   Sherry Burgess G 02/14/2014, 6:40 AM

## 2014-02-14 NOTE — Care Management Utilization Note (Signed)
UR complete 

## 2014-02-15 LAB — GLUCOSE, CAPILLARY
GLUCOSE-CAPILLARY: 275 mg/dL — AB (ref 70–99)
Glucose-Capillary: 239 mg/dL — ABNORMAL HIGH (ref 70–99)
Glucose-Capillary: 389 mg/dL — ABNORMAL HIGH (ref 70–99)
Glucose-Capillary: 215 mg/dL — ABNORMAL HIGH (ref 70–99)

## 2014-02-15 LAB — TROPONIN I
Troponin I: <0.03 ng/mL (ref <0.031)
Troponin I: <0.03 ng/mL (ref <0.031)

## 2014-02-15 LAB — BASIC METABOLIC PANEL
Anion gap: 6 (ref 5–15)
BUN: 7 mg/dL (ref 6–23)
CHLORIDE: 103 meq/L (ref 96–112)
CO2: 27 mmol/L (ref 19–32)
CREATININE: 0.63 mg/dL (ref 0.50–1.10)
Calcium: 8.2 mg/dL — ABNORMAL LOW (ref 8.4–10.5)
GFR calc Af Amer: >90 mL/min (ref >90)
GFR calc non Af Amer: >90 mL/min (ref >90)
Glucose, Bld: 247 mg/dL — ABNORMAL HIGH (ref 70–99)
Potassium: 3.3 mmol/L — ABNORMAL LOW (ref 3.5–5.1)
Sodium: 136 mmol/L (ref 135–145)

## 2014-02-15 MED ORDER — POTASSIUM CHLORIDE CRYS ER 20 MEQ PO TBCR
20.0000 meq | EXTENDED_RELEASE_TABLET | Freq: Every day | ORAL | Status: DC
  Administered 2014-02-15 – 2014-02-16 (×2): 20 meq via ORAL
  Filled 2014-02-15 (×2): qty 1

## 2014-02-15 MED ORDER — INSULIN DETEMIR 100 UNIT/ML ~~LOC~~ SOLN
35.0000 [IU] | Freq: Every day | SUBCUTANEOUS | Status: DC
  Administered 2014-02-15 – 2014-02-16 (×2): 35 [IU] via SUBCUTANEOUS
  Filled 2014-02-15 (×7): qty 0.35

## 2014-02-15 MED ORDER — DOCUSATE SODIUM 100 MG PO CAPS
100.0000 mg | ORAL_CAPSULE | Freq: Two times a day (BID) | ORAL | Status: DC
  Administered 2014-02-15 – 2014-02-16 (×3): 100 mg via ORAL
  Filled 2014-02-15 (×3): qty 1

## 2014-02-15 MED ORDER — INSULIN ASPART 100 UNIT/ML ~~LOC~~ SOLN
10.0000 [IU] | Freq: Three times a day (TID) | SUBCUTANEOUS | Status: DC
  Administered 2014-02-15 – 2014-02-16 (×3): 10 [IU] via SUBCUTANEOUS

## 2014-02-15 NOTE — Progress Notes (Signed)
Nutrition Brief Note  Patient identified on the Malnutrition Screening Tool (MST) Report and  RD consulted for nutrition education regarding diabetes.    Wt Readings from Last 15 Encounters:  02/14/14 209 lb 3.5 oz (94.9 kg)    Body mass index is 32.76 kg/(m^2). Patient meets criteria for obesity class I based on current BMI. She is newly diagnosed with diabetes and any weight loss she may have experienced likely related to same.   Lab Results  Component Value Date   HGBA1C 14.1* 02/12/2014    RD provided "Carbohydrate Counting for People with Diabetes", Plate method, Healthy Grocery shopping list handouts in addition to Booklets already provided by diabetes coordinator team. Discussed different food groups and their effects on blood sugar, emphasizing carbohydrate-containing foods. Provided list of carbohydrates and recommended serving sizes of common foods. Reviewed label reading and practiced cho counting with foods she usually consumes.  Emphasized importance of controlled and consistent carbohydrate intake throughout the day. Provided examples of ways to balance meals/snacks and encouraged intake of high-fiber, whole grain complex carbohydrates. Teach back method used.  Expect good compliance. Pt has good family support and is motivated to make the necessary dietary changes to improve her blood glucose control.  Current diet order is CHO Modifed, patient is consuming approximately 100% of meals at this time. Labs and medications reviewed.   Colman Cater MS,RD,CSG,LDN Office: [phone] Pager: [phone]

## 2014-02-15 NOTE — Progress Notes (Signed)
Subjective: The patient is alert and oriented this morning. She was admitted with diabetic ketoacidosis. She has improved her blood sugars are in the 200 range and she is feeling better.  Objective: Vital signs in last 24 hours: Temp:  [98.2 F (36.8 C)-98.6 F (37 C)] 98.4 F (36.9 C) (12/29 0436) Pulse Rate:  [73-105] 73 (12/29 0436) Resp:  [13-20] 18 (12/29 0436) BP: (113-135)/(60-99) 132/73 mmHg (12/29 0436) SpO2:  [99 %-100 %] 100 % (12/29 0436) Weight change:  Last BM Date: 02/14/14  Intake/Output from previous day:   Intake/Output this shift:    Physical Exam: Gen. appearance-the patient is alert and oriented  HEENT negative  Neck supple no JVD or thyroid abnormalities  Lungs clear to P&A  Heart regular rhythm no murmurs  Abdomen soft no palpable organs or masses  Skin warm and dry   Recent Labs  02/12/14 0945 02/12/14 1400  WBC 7.7 7.1  HGB 15.6* 14.6  HCT 45.3 42.8  PLT 169 159   BMET  Recent Labs  02/14/14 0939 02/15/14 0228  NA 131* 136  K 3.1* 3.3*  CL 101 103  CO2 22 27  GLUCOSE 426* 247*  BUN 8 7  CREATININE 0.77 0.63  CALCIUM 8.4 8.2*    Studies/Results: No results found.  Medications:  . enoxaparin (LOVENOX) injection  40 mg Subcutaneous Q24H  . insulin aspart  0-20 Units Subcutaneous TID WC  . insulin aspart  0-5 Units Subcutaneous QHS  . insulin aspart  5 Units Subcutaneous TID WC  . insulin detemir  30 Units Subcutaneous Daily  . living well with diabetes book   Does not apply Once  . magic mouthwash  10 mL Oral TID  . pantoprazole  40 mg Oral Daily    . sodium chloride 100 mL/hr at 02/15/14 3244     Assessment/Plan: 1. New onset diabetes with  diabetic ketoacidosis. Her blood sugars are in the 200 range now. She's been started on long-acting insulin along with short acting insulin. She did receive IV potassium yesterday. Her current blood sugars 247 and potassium 3.3. We will continue potassium repletion and diabetic  teaching.   LOS: 3 days   Lendora Keys G 02/15/2014, 6:23 AM

## 2014-02-15 NOTE — Progress Notes (Signed)
Spoke with patient and her daughter about new diabetes diagnosis. Discussed A1C results (14.1% on 02/12/14) and explained what an A1C is, basic pathophysiology of DM Type 2, basic home care, importance of checking CBGs and maintaining good CBG control to prevent long-term and short-term complications. Reviewed signs and symptoms of hyperglycemia and hypoglycemia along with treatment for both. Reviewed Living Well with Diabetes in detail. Discussed impact of nutrition, exercise, stress, sickness, and medications on diabetes control.  Discussed carbohydrates, carbohydrate goals per day and meal, along with portion sizes. Provided verbal and written information on free outpatient diabetes education class at [facility] and encouraged patient to attend one of the classes.   Have ordered educational booklet, insulin starter kit, and DM videos. Educated patient on insulin injections with vial/syringe and insulin pen. Reviewed contents of insulin flexpen starter kit. Reviewed all steps of insulin pen including attachment of needle, 2-unit air shot, dialing up dose, giving injection, removing needle, disposal of sharps, storage of unused insulin, disposal of insulin etc. Patient able to provide successful return demonstration. Also reviewed all steps for insulin administration with vial and syringe and patient was able to successfully return demonstration of proper technique. Patient reports that she has self administered several insulin shots as an inpatient and she is feeling more comfortable with giving herself insulin injections. Patient verbalized understanding of information discussed and she states that she has no further questions at this time related to diabetes. RNs to provide ongoing basic DM education at bedside with this patient and engage patient to actively check blood glucose and administer insulin injections.  At time of discharge, MD to provide patient with Rxs for glucometer, test strips,  lancets, insulin pens, and insulin pen needles.  Thanks, Barnie Alderman, RN, MSN, CCRN Diabetes Coordinator Inpatient Diabetes Program [phone] (Team Pager) [phone] ([facility] office) [phone] [place] office)

## 2014-02-15 NOTE — Progress Notes (Signed)
Inpatient Diabetes Program Recommendations  AACE/ADA: New Consensus Statement on Inpatient Glycemic Control (2013)  Target Ranges:  Prepandial:   less than 140 mg/dL      Peak postprandial:   less than 180 mg/dL (1-2 hours)      Critically ill patients:  140 - 180 mg/dL   Results for Sherry Burgess, Sherry Burgess (MRN [id number]) as of 02/15/2014 08:27  Ref. Range 02/14/2014 07:54 02/14/2014 11:40 02/14/2014 16:27 02/14/2014 20:26 02/15/2014 08:00  Glucose-Capillary Latest Range: 70-99 mg/dL 320 (H) 392 (H) 255 (H) 281 (H) 239 (H)   Diabetes history: Newly diagnosed this admission Outpatient Diabetes medications: NA Current orders for Inpatient glycemic control: Levemir 30 units daily, Novolog 0-20 units AC, Novolog 0-5 units HS, Novolog 5 units TID with meals for meal coverage  Inpatient Diabetes Program Recommendations Insulin - Basal: Please consider increasing Levemir to 35 units daily. Insulin - Meal Coverage: Please consider increasing meal coverage to Novolog 10 units TID with meals. HgbA1C: A1C 14.1% on 02/12/14.  Thanks, Barnie Alderman, RN, MSN, CCRN, CDE Diabetes Coordinator Inpatient Diabetes Program [phone] (Team Pager) [phone] ([facility] office) [phone] [facility] office)

## 2014-02-15 NOTE — Progress Notes (Addendum)
Pt does not want to get stuck by lab for troponins any more. She states that the doctor said her heart was fine and that she would be leaving in the morning and she does not think she needs to be stuck anymore. Pt has had 11 troponins drawn since admission. Each has been negative.

## 2014-02-16 LAB — GLUCOSE, CAPILLARY: GLUCOSE-CAPILLARY: 356 mg/dL — AB (ref 70–99)

## 2014-02-16 MED ORDER — POTASSIUM CHLORIDE CRYS ER 20 MEQ PO TBCR: 20.0000 meq | EXTENDED_RELEASE_TABLET | Freq: Every day | ORAL | Status: DC

## 2014-02-16 NOTE — Discharge Summary (Signed)
Physician Discharge Summary  Sherry Burgess KNL:[id number] DOB: [birth year] DOA: 02/12/2014  PCP: Lanette Hampshire, MD  Admit date: 02/12/2014 Discharge date: 02/16/2014     Discharge Diagnoses:  1. New onset diabetes with diabetic ketoacidosis 2. Low serum potassium level hypokalemia 3.   Discharge Condition: Stable Disposition: Home   Diet recommendation: 2000-calorie ADA diet   Filed Weights   02/12/14 0903 02/13/14 0500 02/14/14 0500  Weight: 104.327 kg (230 lb) 104 kg (229 lb 4.5 oz) 94.9 kg (209 lb 3.5 oz)    History of present illness:  The patient presented to the emergency room with generalized weakness. Is felt she had gastroesophageal reflux her blood sugar was found to be extremely high greater than 600 and she is found to be in diabetic ketoacidosis  Hospital Course:  The patient was admitted to ICU after being seen in ED and started on IV insulin infusion per DKA protocol. Aggressive hydration was started. Her sugars gradually decreased and she was able to be started on basal insulin Levemir along with sliding scale Novolin R. She did have low serum potassium which was repleted. She did receive a diabetic teaching and progressively improved. She moved out of ICU to regular bed on December 28 and continued to progressively improved. She continued medications listed below  Discharge Instructions The patient was instructed to continue Levemir 35 units daily each a.m. and to continue sliding scale Humalog insulin before meals and at bedtime. I instructed the patient to have family member pick up these prescriptions at my office today also will give prescription for Accu-Chek monitor and strips. She will be given appointment to primary care care physician's office in one week.    Medication List    STOP taking these medications        ondansetron 4 MG tablet  Commonly known as:  ZOFRAN      TAKE these medications        magic mouthwash Soln  Swish and spit  15-30 mLs 4 (four) times daily.     pantoprazole 40 MG tablet  Commonly known as:  PROTONIX  Take 40 mg by mouth 2 (two) times daily.     potassium chloride SA 20 MEQ tablet  Commonly known as:  K-DUR,KLOR-CON  Take 1 tablet (20 mEq total) by mouth daily.       No Known Allergies  The results of significant diagnostics from this hospitalization (including imaging, microbiology, ancillary and laboratory) are listed below for reference.    Significant Diagnostic Studies: Dg Chest Portable 1 View  02/12/2014   CLINICAL DATA:  Weakness and hyperglycemia.  EXAM: PORTABLE CHEST - 1 VIEW  COMPARISON:  06/03/2007  FINDINGS: Lungs are adequately inflated without consolidation or effusion. The cardiomediastinal silhouette and remainder of the exam is unchanged.  IMPRESSION: No active disease.   Electronically Signed   By: Marin Olp M.D.   On: 02/12/2014 11:19    Microbiology: Recent Results (from the past 240 hour(s))  MRSA PCR Screening     Status: None   Collection Time: 02/12/14  3:10 PM  Result Value Ref Range Status   MRSA by PCR NEGATIVE NEGATIVE Final    Comment:        The GeneXpert MRSA Assay (FDA approved for NASAL specimens only), is one component of a comprehensive MRSA colonization surveillance program. It is not intended to diagnose MRSA infection nor to guide or monitor treatment for MRSA infections.      Labs: Basic Metabolic Panel:  Recent  Labs Lab 02/12/14 2250 02/13/14 0153 02/13/14 1041 02/14/14 0939 02/15/14 0228  NA 135 134* 131* 131* 136  K 3.6 2.9* 3.7 3.1* 3.3*  CL 108 107 103 101 103  CO2 22 20 21 22 27   GLUCOSE 187* 156* 310* 426* 247*  BUN 15 13 12 8 7   CREATININE 0.80 0.74 0.81 0.77 0.63  CALCIUM 8.3* 8.3* 8.2* 8.4 8.2*   Liver Function Tests:  Recent Labs Lab 02/12/14 1359  AST 14  ALT 22  ALKPHOS 100  BILITOT 1.3*  PROT 7.9  ALBUMIN 3.9    Recent Labs Lab 02/12/14 1359  LIPASE 33   No results for input(s): AMMONIA  in the last 168 hours. CBC:  Recent Labs Lab 02/12/14 0945 02/12/14 1400  WBC 7.7 7.1  HGB 15.6* 14.6  HCT 45.3 42.8  MCV 85.8 85.6  PLT 169 159   Cardiac Enzymes:  Recent Labs Lab 02/14/14 1501 02/14/14 1944 02/15/14 0228 02/15/14 0724 02/15/14 1418  TROPONINI <0.03 <0.03 <0.03 <0.03 <0.03   BNP: BNP (last 3 results) No results for input(s): PROBNP in the last 8760 hours. CBG:  Recent Labs Lab 02/14/14 2026 02/15/14 0800 02/15/14 1111 02/15/14 1719 02/15/14 2012  GLUCAP 281* 239* 389* 215* 275*    Active Problems:   DKA (diabetic ketoacidoses)   Thrush, oral   Diabetes mellitus   Time coordinating discharge: 45 minutes  Signed:  Marjean Donna, MD 02/16/2014, 6:34 AM

## 2014-02-16 NOTE — Care Management Utilization Note (Signed)
UR complete 

## 2014-02-16 NOTE — Progress Notes (Signed)
Patient discharged home today with instructions given on medications,and follow up visits,patient verbalized understanding. Prescription sent with patient. Accompanied by staff to an awaiting vehicle.

## 2015-04-10 ENCOUNTER — Other Ambulatory Visit (HOSPITAL_COMMUNITY)
Admission: RE | Admit: 2015-04-10 | Discharge: 2015-04-10 | Disposition: A | Discharge status: Home / Self Care | Payer: BLUE CROSS/BLUE SHIELD | Source: Ambulatory Visit | Attending: Internal Medicine | Admitting: Internal Medicine

## 2015-04-10 DIAGNOSIS — I10 Essential (primary) hypertension: Secondary | ICD-10-CM | POA: Insufficient documentation

## 2015-04-10 DIAGNOSIS — E118 Type 2 diabetes mellitus with unspecified complications: Secondary | ICD-10-CM | POA: Diagnosis present

## 2015-04-10 LAB — URINALYSIS, ROUTINE W REFLEX MICROSCOPIC
Bilirubin Urine: NEGATIVE
GLUCOSE, UA: NEGATIVE mg/dL
KETONES UR: NEGATIVE mg/dL
Nitrite: NEGATIVE
PROTEIN: NEGATIVE mg/dL
Specific Gravity, Urine: 1.025 (ref 1.005–1.030)
pH: 5 (ref 5.0–8.0)

## 2015-04-10 LAB — CBC WITH DIFFERENTIAL/PLATELET
BASOS ABS: 0 10*3/uL (ref 0.0–0.1)
Basophils Relative: 1 %
EOS ABS: 0.1 10*3/uL (ref 0.0–0.7)
Eosinophils Relative: 3 %
HCT: 42.7 % (ref 36.0–46.0)
Hemoglobin: 14.2 g/dL (ref 12.0–15.0)
Lymphocytes Relative: 48 %
Lymphs Abs: 2.1 10*3/uL (ref 0.7–4.0)
MCH: 29.2 pg (ref 26.0–34.0)
MCHC: 33.3 g/dL (ref 30.0–36.0)
MCV: 87.9 fL (ref 78.0–100.0)
MONO ABS: 0.4 10*3/uL (ref 0.1–1.0)
Monocytes Relative: 10 %
NEUTROS ABS: 1.6 10*3/uL — AB (ref 1.7–7.7)
Neutrophils Relative %: 38 %
PLATELETS: 183 10*3/uL (ref 150–400)
RBC: 4.86 MIL/uL (ref 3.87–5.11)
RDW: 13 % (ref 11.5–15.5)
WBC: 4.3 10*3/uL (ref 4.0–10.5)

## 2015-04-10 LAB — URINE MICROSCOPIC-ADD ON: Bacteria, UA: NONE SEEN

## 2015-04-10 LAB — LIPID PANEL
CHOL/HDL RATIO: 6.8 ratio
Cholesterol: 246 mg/dL — ABNORMAL HIGH (ref 0–200)
HDL: 36 mg/dL — AB (ref >40)
LDL CALC: 185 mg/dL — AB (ref 0–99)
Triglycerides: 123 mg/dL (ref <150)
VLDL: 25 mg/dL (ref 0–40)

## 2015-04-10 LAB — COMPREHENSIVE METABOLIC PANEL
ALBUMIN: 4.4 g/dL (ref 3.5–5.0)
ALT: 20 U/L (ref 14–54)
ANION GAP: 7 (ref 5–15)
AST: 17 U/L (ref 15–41)
Alkaline Phosphatase: 71 U/L (ref 38–126)
BILIRUBIN TOTAL: 1 mg/dL (ref 0.3–1.2)
BUN: 15 mg/dL (ref 6–20)
CALCIUM: 9.1 mg/dL (ref 8.9–10.3)
CO2: 28 mmol/L (ref 22–32)
Chloride: 104 mmol/L (ref 101–111)
Creatinine, Ser: 0.87 mg/dL (ref 0.44–1.00)
GFR calc Af Amer: >60 mL/min (ref >60)
GFR calc non Af Amer: >60 mL/min (ref >60)
Glucose, Bld: 236 mg/dL — ABNORMAL HIGH (ref 65–99)
POTASSIUM: 4.1 mmol/L (ref 3.5–5.1)
Sodium: 139 mmol/L (ref 135–145)
TOTAL PROTEIN: 7.5 g/dL (ref 6.5–8.1)

## 2015-04-10 LAB — TSH: TSH: 1.218 u[IU]/mL (ref 0.350–4.500)

## 2015-04-11 LAB — MICROALBUMIN / CREATININE URINE RATIO
CREATININE, UR: 132 mg/dL
MICROALB/CREAT RATIO: 5.5 mg/g{creat} (ref 0.0–30.0)
Microalb, Ur: 7.3 ug/mL — ABNORMAL HIGH

## 2015-04-11 LAB — HEMOGLOBIN A1C
Hgb A1c MFr Bld: 10.1 % — ABNORMAL HIGH (ref 4.8–5.6)
MEAN PLASMA GLUCOSE: 243 mg/dL

## 2015-04-11 LAB — VITAMIN D 25 HYDROXY (VIT D DEFICIENCY, FRACTURES): VIT D 25 HYDROXY: 7.7 ng/mL — AB (ref 30.0–100.0)

## 2017-03-03 ENCOUNTER — Other Ambulatory Visit (HOSPITAL_COMMUNITY): Payer: Self-pay | Admitting: Family Medicine

## 2017-03-03 DIAGNOSIS — Z1231 Encounter for screening mammogram for malignant neoplasm of breast: Secondary | ICD-10-CM

## 2017-03-03 DIAGNOSIS — Z13820 Encounter for screening for osteoporosis: Secondary | ICD-10-CM

## 2017-03-10 ENCOUNTER — Encounter: Payer: Self-pay | Admitting: Internal Medicine

## 2017-03-19 ENCOUNTER — Ambulatory Visit (HOSPITAL_COMMUNITY): Payer: BLUE CROSS/BLUE SHIELD

## 2017-03-19 ENCOUNTER — Encounter (HOSPITAL_COMMUNITY): Payer: Self-pay

## 2017-03-19 ENCOUNTER — Other Ambulatory Visit (HOSPITAL_COMMUNITY): Payer: BLUE CROSS/BLUE SHIELD

## 2017-03-31 ENCOUNTER — Ambulatory Visit: Payer: BLUE CROSS/BLUE SHIELD

## 2017-04-03 ENCOUNTER — Ambulatory Visit (INDEPENDENT_AMBULATORY_CARE_PROVIDER_SITE_OTHER): Payer: Medicare Other

## 2017-04-03 DIAGNOSIS — Z1211 Encounter for screening for malignant neoplasm of colon: Secondary | ICD-10-CM

## 2017-04-03 MED ORDER — PEG-KCL-NACL-NASULF-NA ASC-C 100 G PO SOLR: 1.0000 | ORAL | 0 refills | Status: DC

## 2017-04-03 NOTE — Progress Notes (Signed)
Ok to schedule.  DM meds: half night before and none morning of.  On prep day: Check CBG ac and hs as well as if the patient feels like their blood sugar is off. Can use soda, juice (that's in CLEAR LIQUIDS) as needed for any low blood sugar.  Check CBG on arrival to endo unit. 

## 2017-04-03 NOTE — Patient Instructions (Addendum)
Sherry Burgess  August 11, 1950 MRN: [id number]     Procedure Date: 05/16/17 Time to register: 8:15 Place to register: [facility] Procedure Time: 9:15 Scheduled provider: Barney Drain, MD    PREPARATION FOR COLONOSCOPY WITH SUPREP BOWEL PREP KIT  Note: Suprep Bowel Prep Kit is a split-dose (2day) regimen. Consumption of BOTH 6-ounce bottles is required for a complete prep.  Please notify us immediately if you are diabetic, take iron supplements, or if you are on Coumadin or any other blood thinners.  Please hold the following medications: see below                                                                                                                                                  1 DAY BEFORE PROCEDURE:  DATE: 05/15/17   DAY: Thursday Continue clear liquids the entire day - NO SOLID FOOD.   Diabetic medications adjustments for today: take half of your dose of your diabetic medication this evening   At 6:00pm: Complete steps 1 through 4 below, using ONE (1) 6-ounce bottle, before going to bed. Step 1:  Pour ONE (1) 6-ounce bottle of SUPREP liquid into the mixing container.  Step 2:  Add cool drinking water to the 16 ounce line on the container and mix.  Note: Dilute the solution concentrate as directed prior to use. Step 3:  DRINK ALL the liquid in the container. Step 4:  You MUST drink an additional two (2) or more 16 ounce containers of water  over the next one (1) hour.   Continue clear liquids only, until midnight. Do not eat or drink anything after midnight. EXCEPTION: If you take medications for your heart, blood pressure, or breathing, you may take these medications with a small amount of clear liquid.   DAY OF PROCEDURE:   DATE: 05/16/17   DAY: Friday  Diabetic medications adjustments for today: hold diabetic medication on this morning.   5 hours before your procedure at 4:15 Step 1:  Pour ONE (1) 6-ounce bottle of SUPREP liquid into the mixing  container.  Step 2:  Add cool drinking water to the 16 ounce line on the container and mix.  Note: Dilute the solution concentrate as directed prior to use. Step 3:  DRINK ALL the liquid in the container. Step 4:  You MUST drink an additional two (2) or more 16 ounce containers of water over the next one (1) hour. You MUST complete the final glass of water at least 3 hours before your colonoscopy.   Nothing by mouth past 6:15am  You may take your morning medications with sip of water unless we have instructed otherwise.    Please see below for Dietary Information.  CLEAR LIQUIDS INCLUDE:  Water Jello (NOT red in color)   Ice Popsicles (NOT red in color)   Tea (sugar  ok, no milk/cream) Powdered fruit flavored drinks  Coffee (sugar ok, no milk/cream) Gatorade/ Lemonade/ Kool-Aid  (NOT red in color)   Juice: apple, white grape, white cranberry Soft drinks  Clear bullion, consomme, broth (fat free beef/chicken/vegetable)  Carbonated beverages (any kind)  Strained chicken noodle soup Hard Candy   Remember: Clear liquids are liquids that will allow you to see your fingers on the other side of a clear glass. Be sure liquids are NOT red in color, and not cloudy, but CLEAR.  DO NOT EAT OR DRINK ANY OF THE FOLLOWING:  Dairy products of any kind   Cranberry juice Tomato juice / V8 juice   Grapefruit juice Orange juice     Red grape juice  Do not eat any solid foods, including such foods as: cereal, oatmeal, yogurt, fruits, vegetables, creamed soups, eggs, bread, crackers, pureed foods in a blender, etc.   HELPFUL HINTS FOR DRINKING PREP SOLUTION:   Make sure prep is extremely cold. Mix and refrigerate the the morning of the prep. You may also put in the freezer.   You may try mixing some Crystal Light or Country Time Lemonade if you prefer. Mix in small amounts; add more if necessary.  Try drinking through a straw  Rinse mouth with water or a mouthwash between glasses, to remove  after-taste.  Try sipping on a cold beverage /ice/ popsicles between glasses of prep.  Place a piece of sugar-free hard candy in mouth between glasses.  If you become nauseated, try consuming smaller amounts, or stretch out the time between glasses. Stop for 30-60 minutes, then slowly start back drinking.     OTHER INSTRUCTIONS  You will need a responsible adult at least 66 years of age to accompany you and drive you home. This person must remain in the waiting room during your procedure. The hospital will cancel your procedure if you do not have a responsible adult with you.   1. Wear loose fitting clothing that is easily removed. 2. Leave jewelry and other valuables at home.  3. Remove all body piercing jewelry and leave at home. 4. Total time from sign-in until discharge is approximately 2-3 hours. 5. You should go home directly after your procedure and rest. You can resume normal activities the day after your procedure. 6. The day of your procedure you should not:  Drive  Make legal decisions  Operate machinery  Drink alcohol  Return to work   You may call the office (Dept: [phone]) before 5:00pm, or page the doctor on call [phone]) after 5:00pm, for further instructions, if necessary.   Insurance Information YOU WILL NEED TO CHECK WITH YOUR INSURANCE COMPANY FOR THE BENEFITS OF COVERAGE YOU HAVE FOR THIS PROCEDURE.  UNFORTUNATELY, NOT ALL INSURANCE COMPANIES HAVE BENEFITS TO COVER ALL OR PART OF THESE TYPES OF PROCEDURES.  IT IS YOUR RESPONSIBILITY TO CHECK YOUR BENEFITS, HOWEVER, WE WILL BE GLAD TO ASSIST YOU WITH ANY CODES YOUR INSURANCE COMPANY MAY NEED.    PLEASE NOTE THAT MOST INSURANCE COMPANIES WILL NOT COVER A SCREENING COLONOSCOPY FOR PEOPLE UNDER THE AGE OF 50  IF YOU HAVE BCBS INSURANCE, YOU MAY HAVE BENEFITS FOR A SCREENING COLONOSCOPY BUT IF POLYPS ARE FOUND THE DIAGNOSIS WILL CHANGE AND THEN YOU MAY HAVE A DEDUCTIBLE THAT WILL NEED TO BE MET. SO  PLEASE MAKE SURE YOU CHECK YOUR BENEFITS FOR A SCREENING COLONOSCOPY AS WELL AS A DIAGNOSTIC COLONOSCOPY.

## 2017-04-03 NOTE — Progress Notes (Signed)
Gastroenterology Pre-Procedure Review  Request Date:04/03/17 Requesting Physician: Denyce Robert NP (no previous tcs)  PATIENT REVIEW QUESTIONS: The patient responded to the following health history questions as indicated:    1. Diabetes Melitis: yes, metformin, glipizide,levemir 2. Joint replacements in the past 12 months: no 3. Major health problems in the past 3 months: no 4. Has an artificial valve or MVP: no 5. Has a defibrillator: no 6. Has been advised in past to take antibiotics in advance of a procedure like teeth cleaning: no 7. Family history of colon cancer: yes paternal uncle  In 29's 8. Alcohol Use: no 9. History of sleep apnea: no  10. History of coronary artery or other vascular stents placed within the last 12 months: no 11. History of any prior anesthesia complications: no    MEDICATIONS & ALLERGIES:    Patient reports the following regarding taking any blood thinners:   Plavix? no Aspirin? no Coumadin? no Brilinta? no Xarelto? no Eliquis? no Pradaxa? no Savaysa? no Effient? no  Patient confirms/reports the following medications:  Current Outpatient Medications  Medication Sig Dispense Refill  . atorvastatin (LIPITOR) 40 MG tablet Take 40 mg by mouth daily.    . cholecalciferol (VITAMIN D) 400 units TABS tablet Take 400 Units by mouth.    . fluticasone (FLONASE) 50 MCG/ACT nasal spray Place into both nostrils as needed for allergies or rhinitis.    Marland Kitchen glipiZIDE (GLUCOTROL) 5 MG tablet Take by mouth 2 (two) times daily before a meal.    . hydrochlorothiazide (HYDRODIURIL) 25 MG tablet Take 25 mg by mouth daily.    . insulin detemir (LEVEMIR) 100 UNIT/ML injection Inject 45 Units into the skin as needed (sliding scale).    Marland Kitchen losartan (COZAAR) 50 MG tablet Take 50 mg by mouth daily.    . metFORMIN (GLUCOPHAGE) 1000 MG tablet Take 1,000 mg by mouth 2 (two) times daily with a meal.     No current facility-administered medications for this visit.     Patient  confirms/reports the following allergies:  No Known Allergies  No orders of the defined types were placed in this encounter.   AUTHORIZATION INFORMATION Primary Insurance: [place],  ID #: [id number] Pre-Cert / Josem Kaufmann required: no   SCHEDULE INFORMATION: Procedure has been scheduled as follows:  Date: 05/16/17, Time: 9:15 Location:[facility]  This Gastroenterology Pre-Precedure Review Form is being routed to the following provider(s): Walden Field NP

## 2017-04-04 NOTE — Progress Notes (Signed)
Letter mailed to the pt. 

## 2017-04-10 ENCOUNTER — Ambulatory Visit (HOSPITAL_COMMUNITY)
Admission: RE | Admit: 2017-04-10 | Discharge: 2017-04-10 | Disposition: A | Discharge status: Home / Self Care | Payer: Medicare Other | Source: Ambulatory Visit | Attending: Family Medicine | Admitting: Family Medicine

## 2017-04-10 ENCOUNTER — Encounter (HOSPITAL_COMMUNITY): Payer: Self-pay

## 2017-04-10 DIAGNOSIS — Z13820 Encounter for screening for osteoporosis: Secondary | ICD-10-CM | POA: Diagnosis not present

## 2017-04-10 DIAGNOSIS — Z1231 Encounter for screening mammogram for malignant neoplasm of breast: Secondary | ICD-10-CM | POA: Diagnosis not present

## 2017-04-10 DIAGNOSIS — Z780 Asymptomatic menopausal state: Secondary | ICD-10-CM | POA: Diagnosis not present

## 2017-04-10 DIAGNOSIS — M85852 Other specified disorders of bone density and structure, left thigh: Secondary | ICD-10-CM | POA: Diagnosis not present

## 2017-05-06 DIAGNOSIS — Z79899 Other long term (current) drug therapy: Secondary | ICD-10-CM | POA: Diagnosis not present

## 2017-05-06 DIAGNOSIS — E118 Type 2 diabetes mellitus with unspecified complications: Secondary | ICD-10-CM | POA: Diagnosis not present

## 2017-05-06 DIAGNOSIS — E785 Hyperlipidemia, unspecified: Secondary | ICD-10-CM | POA: Diagnosis not present

## 2017-05-06 DIAGNOSIS — I10 Essential (primary) hypertension: Secondary | ICD-10-CM | POA: Diagnosis not present

## 2017-05-12 DIAGNOSIS — I10 Essential (primary) hypertension: Secondary | ICD-10-CM | POA: Diagnosis not present

## 2017-05-12 DIAGNOSIS — E785 Hyperlipidemia, unspecified: Secondary | ICD-10-CM | POA: Diagnosis not present

## 2017-05-12 DIAGNOSIS — Z7984 Long term (current) use of oral hypoglycemic drugs: Secondary | ICD-10-CM | POA: Diagnosis not present

## 2017-05-12 DIAGNOSIS — Z794 Long term (current) use of insulin: Secondary | ICD-10-CM | POA: Diagnosis not present

## 2017-05-12 DIAGNOSIS — E118 Type 2 diabetes mellitus with unspecified complications: Secondary | ICD-10-CM | POA: Diagnosis not present

## 2017-05-14 NOTE — Progress Notes (Signed)
pts pharmacy did not have moviprep, I called in Rx to [facility]. Linda from [employer] called- moviprep is not covered but suprep is. I gave her rx for suprep and have changed the instructions and faxed them to [facility]. She said she would give them to the patient. Pt is aware of the change.

## 2017-05-16 ENCOUNTER — Encounter (HOSPITAL_COMMUNITY): Payer: Self-pay

## 2017-05-16 ENCOUNTER — Encounter (HOSPITAL_COMMUNITY)
Admission: RE | Disposition: A | Discharge status: Home / Self Care | Payer: Self-pay | Source: Ambulatory Visit | Attending: Gastroenterology

## 2017-05-16 ENCOUNTER — Ambulatory Visit (HOSPITAL_COMMUNITY)
Admission: RE | Admit: 2017-05-16 | Discharge: 2017-05-16 | Disposition: A | Discharge status: Home / Self Care | Payer: Medicare Other | Source: Ambulatory Visit | Attending: Gastroenterology | Admitting: Gastroenterology

## 2017-05-16 ENCOUNTER — Other Ambulatory Visit: Payer: Self-pay

## 2017-05-16 DIAGNOSIS — D123 Benign neoplasm of transverse colon: Secondary | ICD-10-CM | POA: Insufficient documentation

## 2017-05-16 DIAGNOSIS — J45909 Unspecified asthma, uncomplicated: Secondary | ICD-10-CM | POA: Insufficient documentation

## 2017-05-16 DIAGNOSIS — Z1212 Encounter for screening for malignant neoplasm of rectum: Secondary | ICD-10-CM

## 2017-05-16 DIAGNOSIS — I10 Essential (primary) hypertension: Secondary | ICD-10-CM | POA: Diagnosis not present

## 2017-05-16 DIAGNOSIS — Z1211 Encounter for screening for malignant neoplasm of colon: Secondary | ICD-10-CM | POA: Diagnosis not present

## 2017-05-16 DIAGNOSIS — Z794 Long term (current) use of insulin: Secondary | ICD-10-CM | POA: Insufficient documentation

## 2017-05-16 DIAGNOSIS — E119 Type 2 diabetes mellitus without complications: Secondary | ICD-10-CM | POA: Diagnosis not present

## 2017-05-16 DIAGNOSIS — K219 Gastro-esophageal reflux disease without esophagitis: Secondary | ICD-10-CM | POA: Diagnosis not present

## 2017-05-16 DIAGNOSIS — Z87891 Personal history of nicotine dependence: Secondary | ICD-10-CM | POA: Diagnosis not present

## 2017-05-16 DIAGNOSIS — Q438 Other specified congenital malformations of intestine: Secondary | ICD-10-CM | POA: Insufficient documentation

## 2017-05-16 DIAGNOSIS — K644 Residual hemorrhoidal skin tags: Secondary | ICD-10-CM | POA: Insufficient documentation

## 2017-05-16 DIAGNOSIS — Z79899 Other long term (current) drug therapy: Secondary | ICD-10-CM | POA: Insufficient documentation

## 2017-05-16 DIAGNOSIS — D120 Benign neoplasm of cecum: Secondary | ICD-10-CM | POA: Diagnosis not present

## 2017-05-16 DIAGNOSIS — K648 Other hemorrhoids: Secondary | ICD-10-CM | POA: Diagnosis not present

## 2017-05-16 HISTORY — PX: POLYPECTOMY: SHX5525

## 2017-05-16 HISTORY — PX: COLONOSCOPY: SHX5424

## 2017-05-16 HISTORY — DX: Type 2 diabetes mellitus without complications: E11.9

## 2017-05-16 HISTORY — DX: Essential (primary) hypertension: I10

## 2017-05-16 HISTORY — DX: Unspecified asthma, uncomplicated: J45.909

## 2017-05-16 LAB — GLUCOSE, CAPILLARY: Glucose-Capillary: 90 mg/dL (ref 65–99)

## 2017-05-16 SURGERY — COLONOSCOPY
Anesthesia: Moderate Sedation

## 2017-05-16 MED ORDER — MIDAZOLAM HCL 5 MG/5ML IJ SOLN
INTRAMUSCULAR | Status: DC | PRN
  Administered 2017-05-16 (×2): 2 mg via INTRAVENOUS

## 2017-05-16 MED ORDER — MEPERIDINE HCL 100 MG/ML IJ SOLN
INTRAMUSCULAR | Status: AC
  Filled 2017-05-16: qty 2

## 2017-05-16 MED ORDER — SIMETHICONE 40 MG/0.6ML PO SUSP
ORAL | Status: AC
  Filled 2017-05-16: qty 0.6

## 2017-05-16 MED ORDER — MIDAZOLAM HCL 5 MG/5ML IJ SOLN
INTRAMUSCULAR | Status: AC
  Filled 2017-05-16: qty 10

## 2017-05-16 MED ORDER — MEPERIDINE HCL 100 MG/ML IJ SOLN
INTRAMUSCULAR | Status: DC | PRN
  Administered 2017-05-16 (×2): 25 mg via INTRAVENOUS

## 2017-05-16 MED ORDER — SODIUM CHLORIDE 0.9 % IV SOLN
INTRAVENOUS | Status: DC
  Administered 2017-05-16: 09:00:00 via INTRAVENOUS

## 2017-05-16 NOTE — H&P (Signed)
Primary Care Physician:  Jani Gravel, MD Primary Gastroenterologist:  Dr. Oneida Alar  Pre-Procedure History & Physical: HPI:  Sherry Burgess is a 67 y.o. female here for [place].  Past Medical History:  Diagnosis Date  . Asthma   . Diabetes mellitus without complication ([place])   . GERD (gastroesophageal reflux disease)   . Hypertension     Past Surgical History:  Procedure Laterality Date  . DILATION AND CURETTAGE OF UTERUS    . OVARY SURGERY      Prior to Admission medications   Medication Sig Start Date End Date Taking? Authorizing Provider  atorvastatin (LIPITOR) 40 MG tablet Take 40 mg by mouth daily.   Yes [provider]  Cholecalciferol 1000 units tablet Take 1,000 Units by mouth daily.    Yes [provider]  fluticasone (FLONASE) 50 MCG/ACT nasal spray Place into both nostrils as needed for allergies or rhinitis.   Yes [provider]  glipiZIDE (GLUCOTROL) 5 MG tablet Take 5 mg by mouth 2 (two) times daily before a meal.    Yes [provider]  hydrochlorothiazide (HYDRODIURIL) 25 MG tablet Take 25 mg by mouth daily.   Yes [provider]  insulin detemir (LEVEMIR) 100 UNIT/ML injection Inject 45 Units into the skin daily as needed (sliding scale:  if sugar goes above 150).    Yes [provider]  losartan (COZAAR) 50 MG tablet Take 50 mg by mouth daily.   Yes [provider]  metFORMIN (GLUCOPHAGE) 1000 MG tablet Take 1,000 mg by mouth 2 (two) times daily with a meal.   Yes [provider]    Allergies as of 04/03/2017  . (No Known Allergies)    Family History  Problem Relation Age of Onset  . Colon cancer Other   . Colon polyps Neg Hx     Social History   Socioeconomic History  . Marital status: Married    Spouse name: Not on file  . Number of children: Not on file  . Years of education: Not on file  . Highest education level: Not on file  Occupational History  . Not on  file  Social Needs  . Financial resource strain: Not on file  . Food insecurity:    Worry: Not on file    Inability: Not on file  . Transportation needs:    Medical: Not on file    Non-medical: Not on file  Tobacco Use  . Smoking status: Former Research scientist (life sciences)  . Smokeless tobacco: Never Used  Substance and Sexual Activity  . Alcohol use: No  . Drug use: Never  . Sexual activity: Not on file  Lifestyle  . Physical activity:    Days per week: Not on file    Minutes per session: Not on file  . Stress: Not on file  Relationships  . Social connections:    Talks on phone: Not on file    Gets together: Not on file    Attends religious service: Not on file    Active member of club or organization: Not on file    Attends meetings of clubs or organizations: Not on file    Relationship status: Not on file  . Intimate partner violence:    Fear of current or ex partner: Not on file    Emotionally abused: Not on file    Physically abused: Not on file    Forced sexual activity: Not on file  Other Topics Concern  . Not on file  Social History Narrative  . Not on file    Review of Systems: See HPI, otherwise negative ROS   Physical Exam: BP (!) 143/82   Pulse 90   Temp 98 F (36.7 C) (Oral)   Resp 16   SpO2 100%  General:   Alert,  pleasant and cooperative in NAD Head:  Normocephalic and atraumatic. Neck:  Supple; Lungs:  Clear throughout to auscultation.    Heart:  Regular rate and rhythm. Abdomen:  Soft, nontender and nondistended. Normal bowel sounds, without guarding, and without rebound.   Neurologic:  Alert and  oriented x4;  grossly normal neurologically.  Impression/Plan:     SCREENING  Plan:  1. TCS TODAY DISCUSSED PROCEDURE, BENEFITS, & RISKS: < 1% chance of medication reaction, bleeding, perforation, or rupture of spleen/liver.

## 2017-05-16 NOTE — Discharge Instructions (Signed)
You have moderate size EXTERNAL hemorrhoids. YOU HAD FOUR POLYPS REMOVED.   DRINK WATER TO KEEP YOUR URINE LIGHT YELLOW.  FOLLOW A HIGH FIBER DIET. AVOID ITEMS THAT CAUSE BLOATING. SEE INFO BELOW.  CONTINUE YOUR WEIGHT LOSS EFFORTS.  WHILE I DO NOT WANT TO ALARM YOU, YOUR BODY MASS INDEX IS OVER 30 WHICH MEANS YOU ARE OBESE. OBESITY IS ASSOCIATED WITH AN INCREASE RISK FOR ALL CANCERS, INCLUDING ESOPHAGEAL AND COLON CANCER. A WEIGHT OF 179 lbs or less WILL keep YOUR BODY MASS INDEX(BMI) UNDER 30.   USE PREPARATION H FOUR TIMES  A DAY IF NEEDED TO RELIEVE RECTAL PAIN/PRESSURE/BLEEDING.  Next colonoscopy in 3 years.  Colonoscopy Care After Read the instructions outlined below and refer to this sheet in the next week. These discharge instructions provide you with general information on caring for yourself after you leave the hospital. While your treatment has been planned according to the most current medical practices available, unavoidable complications occasionally occur. If you have any problems or questions after discharge, call DR. Rayfield Beem, [phone].  ACTIVITY  You may resume your regular activity, but move at a slower pace for the next 24 hours.   Take frequent rest periods for the next 24 hours.   Walking will help get rid of the air and reduce the bloated feeling in your belly (abdomen).   No driving for 24 hours (because of the medicine (anesthesia) used during the test).   You may shower.   Do not sign any important legal documents or operate any machinery for 24 hours (because of the anesthesia used during the test).    NUTRITION  Drink plenty of fluids.   You may resume your normal diet as instructed by your doctor.   Begin with a light meal and progress to your normal diet. Heavy or fried foods are harder to digest and may make you feel sick to your stomach (nauseated).   Avoid alcoholic beverages for 24 hours or as instructed.    MEDICATIONS  You may resume  your normal medications.   WHAT YOU CAN EXPECT TODAY  Some feelings of bloating in the abdomen.   Passage of more gas than usual.   Spotting of blood in your stool or on the toilet paper  .  IF YOU HAD POLYPS REMOVED DURING THE COLONOSCOPY:  Eat a soft diet IF YOU HAVE NAUSEA, BLOATING, ABDOMINAL PAIN, OR VOMITING.    FINDING OUT THE RESULTS OF YOUR TEST Not all test results are available during your visit. DR. Oneida Alar WILL CALL YOU WITHIN 14 DAYS OF YOUR PROCEDUE WITH YOUR RESULTS. Do not assume everything is normal if you have not heard from DR. Chattie Greeson, CALL HER OFFICE AT [phone].  SEEK IMMEDIATE MEDICAL ATTENTION AND CALL THE OFFICE: [phone] IF:  You have more than a spotting of blood in your stool.   Your belly is swollen (abdominal distention).   You are nauseated or vomiting.   You have a temperature over 101F.   You have abdominal pain or discomfort that is severe or gets worse throughout the day.  High-Fiber Diet A high-fiber diet changes your normal diet to include more whole grains, legumes, fruits, and vegetables. Changes in the diet involve replacing refined carbohydrates with unrefined foods. The calorie level of the diet is essentially unchanged. The Dietary Reference Intake (recommended amount) for adult males is 38 grams per day. For adult females, it is 25 grams per day. Pregnant and lactating women should consume 28 grams of fiber per  day. Fiber is the intact part of a plant that is not broken down during digestion. Functional fiber is fiber that has been isolated from the plant to provide a beneficial effect in the body. PURPOSE  Increase stool bulk.   Ease and regulate bowel movements.   Lower cholesterol.   REDUCE RISK OF COLON CANCER  INDICATIONS THAT YOU NEED MORE FIBER  Constipation and hemorrhoids.   Uncomplicated diverticulosis (intestine condition) and irritable bowel syndrome.   Weight management.   As a protective measure  against hardening of the arteries (atherosclerosis), diabetes, and cancer.   GUIDELINES FOR INCREASING FIBER IN THE DIET  Start adding fiber to the diet slowly. A gradual increase of about 5 more grams (2 slices of whole-wheat bread, 2 servings of most fruits or vegetables, or 1 bowl of high-fiber cereal) per day is best. Too rapid an increase in fiber may result in constipation, flatulence, and bloating.   Drink enough water and fluids to keep your urine clear or pale yellow. Water, juice, or caffeine-free drinks are recommended. Not drinking enough fluid may cause constipation.   Eat a variety of high-fiber foods rather than one type of fiber.   Try to increase your intake of fiber through using high-fiber foods rather than fiber pills or supplements that contain small amounts of fiber.   The goal is to change the types of food eaten. Do not supplement your present diet with high-fiber foods, but replace foods in your present diet.    INCLUDE A VARIETY OF FIBER SOURCES  Replace refined and processed grains with whole grains, canned fruits with fresh fruits, and incorporate other fiber sources. White rice, white breads, and most bakery goods contain little or no fiber.   Brown whole-grain rice, buckwheat oats, and many fruits and vegetables are all good sources of fiber. These include: broccoli, Brussels sprouts, cabbage, cauliflower, beets, sweet potatoes, white potatoes (skin on), carrots, tomatoes, eggplant, squash, berries, fresh fruits, and dried fruits.   Cereals appear to be the richest source of fiber. Cereal fiber is found in whole grains and bran. Bran is the fiber-rich outer coat of cereal grain, which is largely removed in refining. In whole-grain cereals, the bran remains. In breakfast cereals, the largest amount of fiber is found in those with "bran" in their names. The fiber content is sometimes indicated on the label.   You may need to include additional fruits and vegetables  each day.   In baking, for 1 cup white flour, you may use the following substitutions:   1 cup whole-wheat flour minus 2 tablespoons.   1/2 cup white flour plus 1/2 cup whole-wheat flour.   Polyps, Colon  A polyp is extra tissue that grows inside your body. Colon polyps grow in the large intestine. The large intestine, also called the colon, is part of your digestive system. It is a long, hollow tube at the end of your digestive tract where your body makes and stores stool. Most polyps are not dangerous. They are benign. This means they are not cancerous. But over time, some types of polyps can turn into cancer. Polyps that are smaller than a pea are usually not harmful. But larger polyps could someday become or may already be cancerous. To be safe, doctors remove all polyps and test them.   PREVENTION There is not one sure way to prevent polyps. You might be able to lower your risk of getting them if you:  Eat more fruits and vegetables and less fatty  food.   Do not smoke.   Avoid alcohol.   Exercise every day.   Lose weight if you are overweight.   Eating more calcium and folate can also lower your risk of getting polyps. Some foods that are rich in calcium are milk, cheese, and broccoli. Some foods that are rich in folate are chickpeas, kidney beans, and spinach.    Hemorrhoids Hemorrhoids are dilated (enlarged) veins around the rectum. Sometimes clots will form in the veins. This makes them swollen and painful. These are called thrombosed hemorrhoids. Causes of hemorrhoids include:  Constipation.   Straining to have a bowel movement.   HEAVY LIFTING  HOME CARE INSTRUCTIONS  Eat a well balanced diet and drink 6 to 8 glasses of water every day to avoid constipation. You may also use a bulk laxative.   Avoid straining to have bowel movements.   Keep anal area dry and clean.   Do not use a donut shaped pillow or sit on the toilet for long periods. This increases blood  pooling and pain.   Move your bowels when your body has the urge; this will require less straining and will decrease pain and pressure.

## 2017-05-16 NOTE — Op Note (Signed)
[facility] Patient Name: Sherry Burgess Procedure Date: 05/16/2017 9:22 AM MRN: [id number] Date of Birth: [birth year] Attending MD: Barney Drain MD, MD CSN: [id number] Age: 67 Admit Type: Outpatient Procedure:                Colonoscopy WITH COLD FORCEPS/SNARE POLYPECTOMY Indications:              Screening for colorectal malignant neoplasm Providers:                Barney Drain MD, MD, Lurline Del, RN, Randa Spike, Technician Referring MD:             Teodora Medici. Kim Medicines:                Meperidine 50 mg IV, Midazolam 4 mg IV Complications:            No immediate complications. Estimated Blood Loss:     Estimated blood loss was minimal. Procedure:                Pre-Anesthesia Assessment:                           - Prior to the procedure, a History and Physical                            was performed, and patient medications and                            allergies were reviewed. The patient's tolerance of                            previous anesthesia was also reviewed. The risks                            and benefits of the procedure and the sedation                            options and risks were discussed with the patient.                            All questions were answered, and informed consent                            was obtained. Prior Anticoagulants: The patient has                            taken no previous anticoagulant or antiplatelet                            agents. ASA Grade Assessment: II - A patient with                            mild systemic disease. After reviewing the risks  and benefits, the patient was deemed in                            satisfactory condition to undergo the procedure.                            After obtaining informed consent, the colonoscope                            was passed under direct vision. Throughout the                            procedure, the patient's  blood pressure, pulse, and                            oxygen saturations were monitored continuously. The                            EC-[id number] ([id number]) scope was introduced through                            the anus and advanced to the the cecum, identified                            by appendiceal orifice and ileocecal valve. The                            colonoscopy was somewhat difficult due to a                            tortuous colon. Successful completion of the                            procedure was aided by straightening and shortening                            the scope to obtain bowel loop reduction and                            COLOWRAP. The patient tolerated the procedure well.                            The quality of the bowel preparation was good. The                            ileocecal valve, appendiceal orifice, and rectum                            were photographed. Scope In: 6:30:16 AM Scope Out: 9:55:15 AM Scope Withdrawal Time: 0 hours 15 minutes 13 seconds  Total Procedure Duration: 0 hours 18 minutes 56 seconds  Findings:      A 2 mm polyp was found in the cecum. The polyp was sessile. The polyp  was removed with a cold biopsy forceps. Resection and retrieval were       complete.      Three sessile polyps were found in the hepatic flexure and cecum. The       polyps were 4 to 6 mm in size. These polyps were removed with a cold       snare. Resection and retrieval were complete.      The recto-sigmoid colon and sigmoid colon were moderately tortuous.      External hemorrhoids were found during retroflexion. The hemorrhoids       were moderate. Impression:               - One 2 mm polyp in the cecum, removed with a cold                            biopsy forceps. Resected and retrieved.                           - Three 4 to 6 mm polyps at the hepatic flexure and                            in the cecum, removed with a cold snare. Resected                             and retrieved.                           - Tortuous LEFT colon.                           - External hemorrhoids. Moderate Sedation:      Moderate (conscious) sedation was administered by the endoscopy nurse       and supervised by the endoscopist. The following parameters were       monitored: oxygen saturation, heart rate, blood pressure, and response       to care. Total physician intraservice time was 29 minutes. Recommendation:           - Repeat colonoscopy in 3 years for surveillance.                           - High fiber diet.                           - Continue present medications.                           - Await pathology results.                           - Patient has a contact number available for                            emergencies. The signs and symptoms of potential                            delayed complications were discussed with the  patient. Return to normal activities tomorrow.                            Written discharge instructions were provided to the                            patient. Procedure Code(s):        --- Professional ---                           [phone], Colonoscopy, flexible; with removal of                            tumor(s), polyp(s), or other lesion(s) by snare                            technique                           45380, 59, Colonoscopy, flexible; with biopsy,                            single or multiple                           99152, Moderate sedation services provided by the                            same physician or other qualified health care                            professional performing the diagnostic or                            therapeutic service that the sedation supports,                            requiring the presence of an independent trained                            observer to assist in the monitoring of the                            patient's level of  consciousness and physiological                            status; initial 15 minutes of intraservice time,                            patient age 66 years or older                           [phone], Moderate sedation services; each additional                            15 minutes intraservice  time Diagnosis Code(s):        --- Professional ---                           Z12.11, Encounter for screening for malignant                            neoplasm of colon                           D12.0, Benign neoplasm of cecum                           D12.3, Benign neoplasm of transverse colon (hepatic                            flexure or splenic flexure)                           K64.8, Other hemorrhoids                           Q43.8, Other specified congenital malformations of                            intestine CPT copyright 2016 American Medical Association. All rights reserved. The codes documented in this report are preliminary and upon coder review may  be revised to meet current compliance requirements. Barney Drain, MD Barney Drain MD, MD 05/16/2017 10:20:19 AM This report has been signed electronically. Number of Addenda: 0

## 2017-05-20 ENCOUNTER — Telehealth: Payer: Self-pay | Admitting: Gastroenterology

## 2017-05-20 NOTE — Telephone Encounter (Signed)
Please call pt. She had FOUR simple adenomas removed.   DRINK WATER TO KEEP YOUR URINE LIGHT YELLOW.  FOLLOW A HIGH FIBER DIET. AVOID ITEMS THAT CAUSE BLOATING.   CONTINUE YOUR WEIGHT LOSS EFFORTS.   A WEIGHT OF 179 lbs or less WILL keep YOUR BODY MASS INDEX(BMI) UNDER 30.   USE PREPARATION H FOUR TIMES  A DAY IF NEEDED TO RELIEVE RECTAL PAIN/PRESSURE/BLEEDING.  Next colonoscopy in 3 years.

## 2017-05-20 NOTE — Telephone Encounter (Signed)
PT is aware.

## 2017-05-20 NOTE — Telephone Encounter (Signed)
LM To call.

## 2017-05-21 ENCOUNTER — Encounter (HOSPITAL_COMMUNITY): Payer: Self-pay | Admitting: Gastroenterology

## 2017-06-09 DIAGNOSIS — E118 Type 2 diabetes mellitus with unspecified complications: Secondary | ICD-10-CM | POA: Diagnosis not present

## 2017-06-09 DIAGNOSIS — Z79899 Other long term (current) drug therapy: Secondary | ICD-10-CM | POA: Diagnosis not present

## 2017-06-09 DIAGNOSIS — Z7984 Long term (current) use of oral hypoglycemic drugs: Secondary | ICD-10-CM | POA: Diagnosis not present

## 2017-06-09 DIAGNOSIS — I10 Essential (primary) hypertension: Secondary | ICD-10-CM | POA: Diagnosis not present

## 2017-07-29 DIAGNOSIS — I10 Essential (primary) hypertension: Secondary | ICD-10-CM | POA: Diagnosis not present

## 2017-07-29 DIAGNOSIS — E785 Hyperlipidemia, unspecified: Secondary | ICD-10-CM | POA: Diagnosis not present

## 2017-08-05 DIAGNOSIS — I10 Essential (primary) hypertension: Secondary | ICD-10-CM | POA: Diagnosis not present

## 2017-08-05 DIAGNOSIS — Z79899 Other long term (current) drug therapy: Secondary | ICD-10-CM | POA: Diagnosis not present

## 2017-08-05 DIAGNOSIS — Z7984 Long term (current) use of oral hypoglycemic drugs: Secondary | ICD-10-CM | POA: Diagnosis not present

## 2017-08-05 DIAGNOSIS — E785 Hyperlipidemia, unspecified: Secondary | ICD-10-CM | POA: Diagnosis not present

## 2017-08-05 DIAGNOSIS — E118 Type 2 diabetes mellitus with unspecified complications: Secondary | ICD-10-CM | POA: Diagnosis not present

## 2017-10-16 DIAGNOSIS — I10 Essential (primary) hypertension: Secondary | ICD-10-CM | POA: Diagnosis not present

## 2017-10-16 DIAGNOSIS — E118 Type 2 diabetes mellitus with unspecified complications: Secondary | ICD-10-CM | POA: Diagnosis not present

## 2017-10-16 DIAGNOSIS — E785 Hyperlipidemia, unspecified: Secondary | ICD-10-CM | POA: Diagnosis not present

## 2017-10-22 DIAGNOSIS — E785 Hyperlipidemia, unspecified: Secondary | ICD-10-CM | POA: Diagnosis not present

## 2017-10-22 DIAGNOSIS — Z79899 Other long term (current) drug therapy: Secondary | ICD-10-CM | POA: Diagnosis not present

## 2017-10-22 DIAGNOSIS — E118 Type 2 diabetes mellitus with unspecified complications: Secondary | ICD-10-CM | POA: Diagnosis not present

## 2017-10-22 DIAGNOSIS — Z7984 Long term (current) use of oral hypoglycemic drugs: Secondary | ICD-10-CM | POA: Diagnosis not present

## 2017-10-22 DIAGNOSIS — I10 Essential (primary) hypertension: Secondary | ICD-10-CM | POA: Diagnosis not present

## 2017-11-19 DIAGNOSIS — Z23 Encounter for immunization: Secondary | ICD-10-CM | POA: Diagnosis not present

## 2017-12-08 DIAGNOSIS — K0889 Other specified disorders of teeth and supporting structures: Secondary | ICD-10-CM | POA: Diagnosis not present

## 2017-12-08 DIAGNOSIS — K029 Dental caries, unspecified: Secondary | ICD-10-CM | POA: Diagnosis not present

## 2017-12-08 DIAGNOSIS — K047 Periapical abscess without sinus: Secondary | ICD-10-CM | POA: Diagnosis not present

## 2018-01-08 DIAGNOSIS — I10 Essential (primary) hypertension: Secondary | ICD-10-CM | POA: Diagnosis not present

## 2018-01-08 DIAGNOSIS — E785 Hyperlipidemia, unspecified: Secondary | ICD-10-CM | POA: Diagnosis not present

## 2018-01-08 DIAGNOSIS — E118 Type 2 diabetes mellitus with unspecified complications: Secondary | ICD-10-CM | POA: Diagnosis not present

## 2018-01-14 DIAGNOSIS — E118 Type 2 diabetes mellitus with unspecified complications: Secondary | ICD-10-CM | POA: Diagnosis not present

## 2018-01-14 DIAGNOSIS — I10 Essential (primary) hypertension: Secondary | ICD-10-CM | POA: Diagnosis not present

## 2018-01-14 DIAGNOSIS — E785 Hyperlipidemia, unspecified: Secondary | ICD-10-CM | POA: Diagnosis not present

## 2018-01-14 DIAGNOSIS — Z79899 Other long term (current) drug therapy: Secondary | ICD-10-CM | POA: Diagnosis not present

## 2018-01-14 DIAGNOSIS — Z7984 Long term (current) use of oral hypoglycemic drugs: Secondary | ICD-10-CM | POA: Diagnosis not present

## 2019-03-16 ENCOUNTER — Encounter: Payer: Self-pay | Admitting: Internal Medicine

## 2019-04-02 ENCOUNTER — Ambulatory Visit: Payer: Medicare Other | Admitting: Gastroenterology

## 2019-04-02 ENCOUNTER — Encounter: Payer: Self-pay | Admitting: Gastroenterology

## 2019-04-02 ENCOUNTER — Other Ambulatory Visit: Payer: Self-pay

## 2019-04-02 VITALS — BP 157/88 | HR 103 | Temp 96.2°F | Ht 66.0 in | Wt 208.2 lb

## 2019-04-02 DIAGNOSIS — R7989 Other specified abnormal findings of blood chemistry: Secondary | ICD-10-CM | POA: Diagnosis not present

## 2019-04-02 NOTE — Progress Notes (Signed)
Primary Care Physician:  Jani Gravel, MD Primary Gastroenterologist:  Dr. Oneida Alar   Chief Complaint  Patient presents with  . Follow-up    Abnormal LFT's,not eating enough protein lately,muscle weakness    HPI:   Sherry Burgess is a 69 y.o. female presenting today at the request of Dr. Maudie Mercury due to abnormal LFTs. I do not have most recent labs available today at time of visit. Last labs scanned in system from Sept 2020 with Alk Phos 54, Tbili 1.3, AST 302, ALT 423. Triglycerides 222, HDL 30, A1c 9.1. Transaminases normal in 2017. No imaging.   Stomach will cramp when drinking water first thing in the morning. Better after coffee. No overt GI bleeding. Chronic issues with indigestion and was prescribed something as needed at night. States she can't remember the name. Doesn't like taking medications. Will take something with fizz to burp and feels better. Uses sprite zero. No dysphagia. Blood sugars have been better lately and not taking insulin currently.   Has been feeling like muscles were weak lately. Had trouble getting up from the floor after playing with granddaughter. Has been to therapy, which has helped a lot. 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 Son had a heart transplant, and she has been caring for him. Had transplant last April 08, 2018.   Changing diet and eating lots of chicken, fish, higher protein content. No sodas routinely. Drinks rare tea. She is not sure if she has gained weight but believes at baseline. No history of blood transfusions. No history of drug use. No alcohol use.    Past Medical History:  Diagnosis Date  . Asthma   . Diabetes mellitus without complication ([place])   . GERD (gastroesophageal reflux disease)   . Hypercholesterolemia   . Hypertension     Past Surgical History:  Procedure Laterality Date  . COLONOSCOPY N/A 05/16/2017   four simple adenomas, surveillanc 2022  . DILATION AND CURETTAGE OF UTERUS    . OVARY SURGERY    . POLYPECTOMY   05/16/2017   Procedure: POLYPECTOMY;  Surgeon: Danie Binder, MD;  Location: [facility];  Service: Endoscopy;;  cecal x2 ; hepatic flexure x2    Current Outpatient Medications  Medication Sig Dispense Refill  . aspirin EC 81 MG tablet Take 81 mg by mouth daily.    Marland Kitchen atorvastatin (LIPITOR) 40 MG tablet Take 40 mg by mouth daily.    . Cholecalciferol 1000 units tablet Take 2,000 Units by mouth daily.     . fluticasone (FLONASE) 50 MCG/ACT nasal spray Place into both nostrils as needed for allergies or rhinitis.    Marland Kitchen glipiZIDE (GLUCOTROL) 5 MG tablet Take 10 mg by mouth daily.     . hydrochlorothiazide (HYDRODIURIL) 25 MG tablet Take 25 mg by mouth daily.    Marland Kitchen losartan (COZAAR) 50 MG tablet Take 50 mg by mouth daily.    . metFORMIN (GLUCOPHAGE) 1000 MG tablet Take 1,000 mg by mouth 2 (two) times daily with a meal.    . Multiple Vitamins-Minerals (MULTIVITAMIN WOMENS 50+ ADV PO) Take by mouth daily.    . vitamin B-12 (CYANOCOBALAMIN) 1000 MCG tablet Take 1,000 mcg by mouth daily.     No current facility-administered medications for this visit.    Allergies as of 04/02/2019  . (No Known Allergies)    Family History  Problem Relation Age of Onset  . Colon cancer Paternal Uncle   . Colon polyps Neg Hx  Social History   Socioeconomic History  . Marital status: Married    Spouse name: Not on file  . Number of children: Not on file  . Years of education: Not on file  . Highest education level: Not on file  Occupational History  . Occupation: retired  Tobacco Use  . Smoking status: Former Research scientist (life sciences)  . Smokeless tobacco: Never Used  Substance and Sexual Activity  . Alcohol use: No  . Drug use: Never  . Sexual activity: Not on file  Other Topics Concern  . Not on file  Social History Narrative  . Not on file   Social Determinants of Health   Financial Resource Strain:   . Difficulty of Paying Living Expenses: Not on file  Food Insecurity:   . Worried About Ship broker in the Last Year: Not on file  . Ran Out of Food in the Last Year: Not on file  Transportation Needs:   . Lack of Transportation (Medical): Not on file  . Lack of Transportation (Non-Medical): Not on file  Physical Activity:   . Days of Exercise per Week: Not on file  . Minutes of Exercise per Session: Not on file  Stress:   . Feeling of Stress : Not on file  Social Connections:   . Frequency of Communication with Friends and Family: Not on file  . Frequency of Social Gatherings with Friends and Family: Not on file  . Attends Religious Services: Not on file  . Active Member of Clubs or Organizations: Not on file  . Attends Archivist Meetings: Not on file  . Marital Status: Not on file  Intimate Partner Violence:   . Fear of Current or Ex-Partner: Not on file  . Emotionally Abused: Not on file  . Physically Abused: Not on file  . Sexually Abused: Not on file    Review of Systems: Gen: Denies any fever, chills, fatigue, weight loss, lack of appetite.  CV: Denies chest pain, heart palpitations, peripheral edema, syncope.  Resp: Denies shortness of breath at rest or with exertion. Denies wheezing or cough.  GI: see HPI GU : Denies urinary burning, urinary frequency, urinary hesitancy MS: Denies joint pain, muscle weakness, cramps, or limitation of movement.  Derm: Denies rash, itching, dry skin Psych: Denies depression, anxiety, memory loss, and confusion Heme: Denies bruising, bleeding, and enlarged lymph nodes.  Physical Exam: BP (!) 157/88   Pulse (!) 103   Temp (!) 96.2 F (35.7 C) (Temporal)   Ht 5' 6" (1.676 m)   Wt 208 lb 3.2 oz (94.4 kg)   BMI 33.60 kg/m  General:   Alert and oriented. Pleasant and cooperative. Well-nourished and well-developed.  Head:  Normocephalic and atraumatic. Eyes:  Without icterus, sclera clear and conjunctiva pink.  Ears:  Normal auditory acuity. Mouth:  No deformity or lesions, oral mucosa pink.  Lungs:  Clear to  auscultation bilaterally.  Heart:  S1, S2 present without murmurs appreciated.  Abdomen:  +BS, soft, non-tender and non-distended. No HSM noted. No guarding or rebound. No masses appreciated.  Rectal:  Deferred  Msk:  Symmetrical without gross deformities. Normal posture. Extremities:  Without edema. Neurologic:  Alert and  oriented x4 Psych:  Alert and cooperative. Normal mood and affect.  ASSESSMENT: DUNG PRIEN is a 69 y.o. female presenting today with history of elevated LFTs, no known liver disease, labs from September with elevated transaminases in the 3-400 range. I do not have most recent labs today. No  other concerning lower or upper GI signs/symptoms. Need to obtain outside labs ASAP, and we will pursue US abdomen complete, as she has never had this. Will at least need baseline Hep C, B serologies if not already done. I highly suspect component of fatty liver at this time in setting of comorbidities. Discussed at length diet, exercise, behavior modification.    PLAN: Requesting labs ASAP  US abdomen complete ordered  Fatty liver diet discussed  Anticipate additional serologies needed after review of labs  Return in 3-4 months  [name], PhD, [facility]

## 2019-04-02 NOTE — Patient Instructions (Addendum)
I am requesting the blood work from your primary care. We will let you know what we need in addition to this.  In the meantime, we have ordered an ultrasound to evaluate your liver.  As we talked about, continue with healthy eating choices, stay active as you can, continue the physical therapy to keep getting stronger. I included a fatty liver handout in case this is what is going on.   It is very important to keep blood sugar controlled and cholesterol in setting of a fatty liver. Keep up the good work and changes!  We will see you back in 3-4 months!  It was a pleasure to see you today. I want to create trusting relationships with patients to provide genuine, compassionate, and quality care. I value your feedback. If you receive a survey regarding your visit,  I greatly appreciate you taking time to fill this out.   [name], PhD, ANP-BC [facility]    Fatty Liver Disease  Fatty liver disease occurs when too much fat has built up in your liver cells. Fatty liver disease is also called hepatic steatosis or steatohepatitis. The liver removes harmful substances from your bloodstream and produces fluids that your body needs. It also helps your body use and store energy from the food you eat. In many cases, fatty liver disease does not cause symptoms or problems. It is often diagnosed when tests are being done for other reasons. However, over time, fatty liver can cause inflammation that may lead to more serious liver problems, such as scarring of the liver (cirrhosis) and liver failure. Fatty liver is associated with insulin resistance, increased body fat, high blood pressure (hypertension), and high cholesterol. These are features of metabolic syndrome and increase your risk for stroke, diabetes, and heart disease. What are the causes? This condition may be caused by:  Drinking too much alcohol.  Poor nutrition.  Obesity.  Cushing's syndrome.  Diabetes.  High  cholesterol.  Certain drugs.  Poisons.  Some viral infections.  Pregnancy. What increases the risk? You are more likely to develop this condition if you:  Abuse alcohol.  Are overweight.  Have diabetes.  Have hepatitis.  Have a high triglyceride level.  Are pregnant. What are the signs or symptoms? Fatty liver disease often does not cause symptoms. If symptoms do develop, they can include:  Fatigue.  Weakness.  Weight loss.  Confusion.  Abdominal pain.  Nausea and vomiting.  Yellowing of your skin and the white parts of your eyes (jaundice).  Itchy skin. How is this diagnosed? This condition may be diagnosed by:  A physical exam and medical history.  Blood tests.  Imaging tests, such as an ultrasound, CT scan, or MRI.  A liver biopsy. A small sample of liver tissue is removed using a needle. The sample is then looked at under a microscope. How is this treated? Fatty liver disease is often caused by other health conditions. Treatment for fatty liver may involve medicines and lifestyle changes to manage conditions such as:  Alcoholism.  High cholesterol.  Diabetes.  Being overweight or obese. Follow these instructions at home:   Do not drink alcohol. If you have trouble quitting, ask your health care provider how to safely quit with the help of medicine or a supervised program. This is important to keep your condition from getting worse.  Eat a healthy diet as told by your health care provider. Ask your health care provider about working with a diet and nutrition specialist (  dietitian) to develop an eating plan.  Exercise regularly. This can help you lose weight and control your cholesterol and diabetes. Talk to your health care provider about an exercise plan and which activities are best for you.  Take over-the-counter and prescription medicines only as told by your health care provider.  Keep all follow-up visits as told by your health care  provider. This is important. Contact a health care provider if: You have trouble controlling your:  Blood sugar. This is especially important if you have diabetes.  Cholesterol.  Drinking of alcohol. Get help right away if:  You have abdominal pain.  You have jaundice.  You have nausea and vomiting.  You vomit blood or material that looks like coffee grounds.  You have stools that are black, tar-like, or bloody. Summary  Fatty liver disease develops when too much fat builds up in the cells of your liver.  Fatty liver disease often causes no symptoms or problems. However, over time, fatty liver can cause inflammation that may lead to more serious liver problems, such as scarring of the liver (cirrhosis).  You are more likely to develop this condition if you abuse alcohol, are pregnant, are overweight, have diabetes, have hepatitis, or have high triglyceride levels.  Contact your health care provider if you have trouble controlling your weight, blood sugar, cholesterol, or drinking of alcohol. This information is not intended to replace advice given to you by your health care provider. Make sure you discuss any questions you have with your health care provider. Document Revised: 01/17/2017 Document Reviewed: 11/13/2016 Elsevier Patient Education  2020 [employer].

## 2019-04-05 ENCOUNTER — Encounter: Payer: Self-pay | Admitting: Gastroenterology

## 2019-04-05 NOTE — Progress Notes (Signed)
Received labs from Feb 07, 2019 by PCP.   Tbili 0.6, Alk Phos 52, AST 173, ALT 136, A1c 6.7.   We need to update LFTs. Please also add Hep C antibody, Hep B surface antigen, Hep B surface antibody and Hep B total antibody, iron, ferritin, TIBC, and CBC.

## 2019-04-06 ENCOUNTER — Other Ambulatory Visit: Payer: Self-pay

## 2019-04-06 DIAGNOSIS — R7989 Other specified abnormal findings of blood chemistry: Secondary | ICD-10-CM

## 2019-04-06 NOTE — Progress Notes (Signed)
Pt is aware and wants to go to [facility].  Orders entered and released.

## 2019-04-08 LAB — CBC WITH DIFFERENTIAL/PLATELET
Basophils Absolute: 0 10*3/uL (ref 0.0–0.2)
Basos: 1 %
EOS (ABSOLUTE): 0.2 10*3/uL (ref 0.0–0.4)
Eos: 3 %
Hematocrit: 41.9 % (ref 34.0–46.6)
Hemoglobin: 14.1 g/dL (ref 11.1–15.9)
Immature Grans (Abs): 0 10*3/uL (ref 0.0–0.1)
Immature Granulocytes: 0 %
Lymphocytes Absolute: 2.4 10*3/uL (ref 0.7–3.1)
Lymphs: 42 %
MCH: 29.8 pg (ref 26.6–33.0)
MCHC: 33.7 g/dL (ref 31.5–35.7)
MCV: 89 fL (ref 79–97)
Monocytes Absolute: 0.4 10*3/uL (ref 0.1–0.9)
Monocytes: 6 %
Neutrophils Absolute: 2.7 10*3/uL (ref 1.4–7.0)
Neutrophils: 48 %
Platelets: 211 10*3/uL (ref 150–450)
RBC: 4.73 x10E6/uL (ref 3.77–5.28)
RDW: 12.1 % (ref 11.7–15.4)
WBC: 5.7 10*3/uL (ref 3.4–10.8)

## 2019-04-08 LAB — HEPATIC FUNCTION PANEL
ALT: 230 IU/L — ABNORMAL HIGH (ref 0–32)
AST: 214 IU/L — ABNORMAL HIGH (ref 0–40)
Albumin: 4.2 g/dL (ref 3.8–4.8)
Alkaline Phosphatase: 44 IU/L (ref 39–117)
Bilirubin Total: 0.8 mg/dL (ref 0.0–1.2)
Bilirubin, Direct: 0.18 mg/dL (ref 0.00–0.40)
Total Protein: 6.6 g/dL (ref 6.0–8.5)

## 2019-04-08 LAB — HEPATITIS B CORE ANTIBODY, TOTAL: Hep B Core Total Ab: NEGATIVE

## 2019-04-08 LAB — HEPATITIS B SURFACE ANTIBODY,QUALITATIVE: Hep B Surface Ab, Qual: NONREACTIVE

## 2019-04-08 LAB — IRON,TIBC AND FERRITIN PANEL
Ferritin: 163 ng/mL — ABNORMAL HIGH (ref 15–150)
Iron Saturation: 33 % (ref 15–55)
Iron: 90 ug/dL (ref 27–139)
Total Iron Binding Capacity: 275 ug/dL (ref 250–450)
UIBC: 185 ug/dL (ref 118–369)

## 2019-04-08 LAB — HEPATITIS B SURFACE ANTIGEN: Hepatitis B Surface Ag: NEGATIVE

## 2019-04-08 LAB — HEPATITIS C ANTIBODY: Hep C Virus Ab: <0.1 s/co ratio (ref 0.0–0.9)

## 2019-04-12 ENCOUNTER — Ambulatory Visit (HOSPITAL_COMMUNITY)
Admission: RE | Admit: 2019-04-12 | Discharge: 2019-04-12 | Disposition: A | Discharge status: Home / Self Care | Payer: Medicare Other | Source: Ambulatory Visit | Attending: Gastroenterology | Admitting: Gastroenterology

## 2019-04-12 ENCOUNTER — Other Ambulatory Visit: Payer: Self-pay

## 2019-04-12 DIAGNOSIS — R7989 Other specified abnormal findings of blood chemistry: Secondary | ICD-10-CM | POA: Insufficient documentation

## 2019-04-16 ENCOUNTER — Other Ambulatory Visit: Payer: Self-pay

## 2019-07-13 ENCOUNTER — Other Ambulatory Visit: Payer: Self-pay

## 2019-07-13 ENCOUNTER — Encounter: Payer: Self-pay | Admitting: Gastroenterology

## 2019-07-13 ENCOUNTER — Ambulatory Visit: Payer: Medicare Other | Admitting: Gastroenterology

## 2019-07-13 DIAGNOSIS — K760 Fatty (change of) liver, not elsewhere classified: Secondary | ICD-10-CM | POA: Diagnosis not present

## 2019-07-13 NOTE — Progress Notes (Signed)
Referring Provider: Jani Gravel, MD Primary Care Physician:  Jani Gravel, MD  Primary GI: Previously Dr. Oneida Alar   Chief Complaint  Patient presents with  . elevated lft's    f/u. Doing okay    HPI:   Sherry Burgess is a 69 y.o. female presenting today with a history of elevated LFTs, transaminases from Sept 2020 in the 3-400 range. Improvement in transaminases noted in Dec 2020 with AST 173, ALT 136. A1c had improved from the 9 range to 6 as well.   Repeat labs most recently in Feb 2021 with AST 214, ALT 230. Negative Hep C, no iron overload. Negative Hep B. Needs Hep A and B vaccination. Currently on Lipitor. Need to have updated labs now and will pursue further extensive serologies if remaining elevated. US abdomen with diffuse fatty liver, spleen normal.   Stopped Lipitor around Feb 2021. 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 Some improvement in joint/muscle stiffness. If sits for awhile her legs will swell. Still moving. Labs recently done with PCP. No abdominal pain. No N/V. No rectal bleeding. No reflux issues.   Has lost 7 lbs since last seen purposefully. Done with 2 of 3 Hep A/B vaccinations. Surveillance colonoscopy due in 2022.   Past Medical History:  Diagnosis Date  . Asthma   . Diabetes mellitus without complication ([place])   . GERD (gastroesophageal reflux disease)   . Hypercholesterolemia   . Hypertension     Past Surgical History:  Procedure Laterality Date  . COLONOSCOPY N/A 05/16/2017   four simple adenomas, surveillanc 2022  . DILATION AND CURETTAGE OF UTERUS    . OVARY SURGERY    . POLYPECTOMY  05/16/2017   Procedure: POLYPECTOMY;  Surgeon: Danie Binder, MD;  [facility];  Service: Endoscopy;;  cecal x2 ; hepatic flexure x2    Current Outpatient Medications  Medication Sig Dispense Refill  . aspirin EC 81 MG tablet Take 81 mg by mouth daily.    Marland Kitchen atorvastatin (LIPITOR) 40 MG tablet Take 40 mg by mouth daily.    . Cholecalciferol 1000 units  tablet Take 2,000 Units by mouth daily.     . fluticasone (FLONASE) 50 MCG/ACT nasal spray Place into both nostrils as needed for allergies or rhinitis.    Marland Kitchen glipiZIDE (GLUCOTROL) 5 MG tablet Take 10 mg by mouth daily.     . hydrochlorothiazide (HYDRODIURIL) 25 MG tablet Take 25 mg by mouth daily.    Marland Kitchen losartan (COZAAR) 50 MG tablet Take 50 mg by mouth daily.    . metFORMIN (GLUCOPHAGE) 1000 MG tablet Take 1,000 mg by mouth 2 (two) times daily with a meal.    . Multiple Vitamins-Minerals (MULTIVITAMIN WOMENS 50+ ADV PO) Take by mouth daily.    . vitamin B-12 (CYANOCOBALAMIN) 1000 MCG tablet Take 1,000 mcg by mouth daily.     No current facility-administered medications for this visit.    Allergies as of 07/13/2019  . (No Known Allergies)    Family History  Problem Relation Age of Onset  . Colon cancer Paternal Uncle   . Colon polyps Neg Hx     Social History   Socioeconomic History  . Marital status: Married    Spouse name: Not on file  . Number of children: Not on file  . Years of education: Not on file  . Highest education level: Not on file  Occupational History  . Occupation: retired  Tobacco Use  . Smoking status: Former Research scientist (life sciences)  . Smokeless  tobacco: Never Used  Substance and Sexual Activity  . Alcohol use: No  . Drug use: Never  . Sexual activity: Not on file  Other Topics Concern  . Not on file  Social History Narrative  . Not on file   Social Determinants of Health   Financial Resource Strain:   . Difficulty of Paying Living Expenses:   Food Insecurity:   . Worried About Charity fundraiser in the Last Year:   . Arboriculturist in the Last Year:   Transportation Needs:   . Film/video editor (Medical):   Marland Kitchen Lack of Transportation (Non-Medical):   Physical Activity:   . Days of Exercise per Week:   . Minutes of Exercise per Session:   Stress:   . Feeling of Stress :   Social Connections:   . Frequency of Communication with Friends and Family:   .  Frequency of Social Gatherings with Friends and Family:   . Attends Religious Services:   . Active Member of Clubs or Organizations:   . Attends Archivist Meetings:   Marland Kitchen Marital Status:     Review of Systems: Gen: Denies fever, chills, anorexia. Denies fatigue, weakness, weight loss.  CV: Denies chest pain, palpitations, syncope, peripheral edema, and claudication. Resp: Denies dyspnea at rest, cough, wheezing, coughing up blood, and pleurisy. GI: see HPI Derm: Denies rash, itching, dry skin Psych: Denies depression, anxiety, memory loss, confusion. No homicidal or suicidal ideation.  Heme: Denies bruising, bleeding, and enlarged lymph nodes.  Physical Exam: BP (!) 167/79   Pulse 90   Temp (!) 97.1 F (36.2 C) (Oral)   Ht 5\' 6"  (1.676 m)   Wt 201 lb 9.6 oz (91.4 kg)   BMI 32.54 kg/m  General:   Alert and oriented. No distress noted. Pleasant and cooperative.  Head:  Normocephalic and atraumatic. Eyes:  Conjuctiva clear without scleral icterus. Mouth:  Oral mucosa pink and moist. Good dentition. No lesions. Abdomen:  +BS, soft, non-tender and non-distended. No rebound or guarding. Limited exam with patient sitting in chair, unable to get on exam table.  Msk:  Symmetrical without gross deformities. Normal posture. Extremities:  Without edema. Neurologic:  Alert and  oriented x4 Psych:  Alert and cooperative. Normal mood and affect.  ASSESSMENT: Sherry Burgess is a 69 y.o. female presenting today with history of elevated transaminases in setting of fatty liver, negative for chronic Hep C or Hep B, negative iron overload. 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 Query if statin also contributing to increasing LFTs. Recent labs completed through PCP, and we have requested ASAP. If remains elevated, will need thorough serologies (AMA, ANA, ASMA, ceruloplasmin, immunoglobulins).   Congratulated on weight loss and dietary efforts. We will see her back  in 6 months regardless.    PLAN:   Obtain outside labs  Continue weight loss, dietary changes  Further serologies if persistently elevated transaminases  Colonoscopy in 2022  Return in 6 months  [name], PhD, [facility]

## 2019-07-13 NOTE — Progress Notes (Signed)
Cc'ed to pcp °

## 2019-07-13 NOTE — Patient Instructions (Signed)
It was great to see you again today!  Continue with the weight loss efforts, healthy diet, and movement.  We will see you in 6 months! I am requesting the blood work from your PCP. I will let you know if we need further test.  I enjoyed seeing you again today! As you know, I value our relationship and want to provide genuine, compassionate, and quality care. I welcome your feedback. If you receive a survey regarding your visit,  I greatly appreciate you taking time to fill this out. See you next time!  [name], PhD, ANP-BC [facility]

## 2020-01-18 ENCOUNTER — Encounter: Payer: Self-pay | Admitting: Gastroenterology

## 2020-01-18 ENCOUNTER — Other Ambulatory Visit: Payer: Self-pay

## 2020-01-18 ENCOUNTER — Encounter: Payer: Self-pay | Admitting: *Deleted

## 2020-01-18 ENCOUNTER — Ambulatory Visit: Payer: Medicare Other | Admitting: Gastroenterology

## 2020-01-18 VITALS — BP 142/77 | HR 85 | Temp 96.9°F | Ht 64.0 in | Wt 211.0 lb

## 2020-01-18 DIAGNOSIS — Z86010 Personal history of colonic polyps: Secondary | ICD-10-CM | POA: Insufficient documentation

## 2020-01-18 DIAGNOSIS — R7989 Other specified abnormal findings of blood chemistry: Secondary | ICD-10-CM | POA: Diagnosis not present

## 2020-01-18 DIAGNOSIS — K760 Fatty (change of) liver, not elsewhere classified: Secondary | ICD-10-CM | POA: Diagnosis not present

## 2020-01-18 NOTE — Progress Notes (Signed)
Referring Provider: Jani Gravel, MD Primary Care Physician:  Jani Gravel, MD Primary GI: Dr. Abbey Chatters   Chief Complaint  Patient presents with  . Follow-up    fu elevated LFT's    HPI:   Sherry Burgess is a 69 y.o. female presenting today with a history of elevated LFTs, transaminases from Sept 2020 in the 3-400 range. Improvement in transaminases noted in Dec 2020 with AST 173, ALT 136. A1c had improved from the 9 range to 6 as well. Repeat labs most recently in Feb 2021 with AST 214, ALT 230. Negative Hep C, no iron overload. Negative Hep B. Completed Hep A/B vaccinations. Requested labs from May 2021 but did not receive: it appears no recent LFTs done. Felt to be dealing with fatty liver, +/- statin involvement.   No longer on a statin. Myopathies resolved. Feels much improved. No abdominal pain, N/V. No overt GI bleeding. No constipation or diarrhea. No GERD. No dysphagia.    Surveillance colonoscopy due to adenomas due in March 2022.   Past Medical History:  Diagnosis Date  . Asthma   . Diabetes mellitus without complication ([place])   . GERD (gastroesophageal reflux disease)   . Hypercholesterolemia   . Hypertension     Past Surgical History:  Procedure Laterality Date  . COLONOSCOPY N/A 05/16/2017   four simple adenomas, surveillanc 2022  . DILATION AND CURETTAGE OF UTERUS    . OVARY SURGERY    . POLYPECTOMY  05/16/2017   Procedure: POLYPECTOMY;  Surgeon: Danie Binder, MD;  Location: [facility];  Service: Endoscopy;;  cecal x2 ; hepatic flexure x2    Current Outpatient Medications  Medication Sig Dispense Refill  . aspirin EC 81 MG tablet Take 81 mg by mouth daily.    . Cholecalciferol 1000 units tablet Take 2,000 Units by mouth daily.     . fluticasone (FLONASE) 50 MCG/ACT nasal spray Place into both nostrils as needed for allergies or rhinitis.    Marland Kitchen glipiZIDE (GLUCOTROL) 5 MG tablet Take 10 mg by mouth daily.     . hydrochlorothiazide (HYDRODIURIL) 25 MG  tablet Take 25 mg by mouth daily.    Marland Kitchen losartan (COZAAR) 50 MG tablet Take 50 mg by mouth daily.    . metFORMIN (GLUCOPHAGE) 1000 MG tablet Take 1,000 mg by mouth 2 (two) times daily with a meal.    . Multiple Vitamins-Minerals (MULTIVITAMIN WOMENS 50+ ADV PO) Take by mouth daily.     No current facility-administered medications for this visit.    Allergies as of 01/18/2020  . (No Known Allergies)    Family History  Problem Relation Age of Onset  . Colon cancer Paternal Uncle   . Colon polyps Neg Hx     Social History   Socioeconomic History  . Marital status: Married    Spouse name: Not on file  . Number of children: Not on file  . Years of education: Not on file  . Highest education level: Not on file  Occupational History  . Occupation: retired  Tobacco Use  . Smoking status: Former Research scientist (life sciences)  . Smokeless tobacco: Never Used  Substance and Sexual Activity  . Alcohol use: No  . Drug use: Never  . Sexual activity: Not on file  Other Topics Concern  . Not on file  Social History Narrative  . Not on file   Social Determinants of Health   Financial Resource Strain:   . Difficulty of Paying Living Expenses:  Not on file  Food Insecurity:   . Worried About Charity fundraiser in the Last Year: Not on file  . Ran Out of Food in the Last Year: Not on file  Transportation Needs:   . Lack of Transportation (Medical): Not on file  . Lack of Transportation (Non-Medical): Not on file  Physical Activity:   . Days of Exercise per Week: Not on file  . Minutes of Exercise per Session: Not on file  Stress:   . Feeling of Stress : Not on file  Social Connections:   . Frequency of Communication with Friends and Family: Not on file  . Frequency of Social Gatherings with Friends and Family: Not on file  . Attends Religious Services: Not on file  . Active Member of Clubs or Organizations: Not on file  . Attends Archivist Meetings: Not on file  . Marital Status: Not on  file    Review of Systems: Gen: Denies fever, chills, anorexia. Denies fatigue, weakness, weight loss.  CV: Denies chest pain, palpitations, syncope, peripheral edema, and claudication. Resp: Denies dyspnea at rest, cough, wheezing, coughing up blood, and pleurisy. GI: see HPI Derm: Denies rash, itching, dry skin Psych: Denies depression, anxiety, memory loss, confusion. No homicidal or suicidal ideation.  Heme: Denies bruising, bleeding, and enlarged lymph nodes.  Physical Exam: BP (!) 142/77   Pulse 85   Temp (!) 96.9 F (36.1 C) (Temporal)   Ht 5\' 4"  (1.626 m)   Wt 211 lb (95.7 kg)   BMI 36.22 kg/m  General:   Alert and oriented. No distress noted. Pleasant and cooperative.  Head:  Normocephalic and atraumatic. Eyes:  Conjuctiva clear without scleral icterus. Mouth:  Mask in place Abdomen:  +BS, soft, non-tender and non-distended. No rebound or guarding. No HSM or masses noted. Msk:  Symmetrical without gross deformities. Normal posture. Extremities:  Without edema. Neurologic:  Alert and  oriented x4 Psych:  Alert and cooperative. Normal mood and affect.  ASSESSMENT: Sherry Burgess is a 69 y.o. female presenting today with history of elevated LFTs in setting of fatty liver, statin use, now coming off statin per PCP and myopathies resolved. Anticipate LFTs will be much improved at this point. We discussed further serologies if remaining persistently elevated despite cessation of statin.  As she is due for 3 year surveillance colonoscopy due to history of polyps, will go ahead and arrange for near future. No concerning lower GI signs/symptoms.    PLAN:  Recheck HFP now  Proceed with colonoscopy by Dr. Abbey Chatters  in near future: the risks, benefits, and alternatives have been discussed with the patient in detail. The patient states understanding and desires to proceed.   Follow-up to be determined  [name], PhD, ANP-BC [facility]

## 2020-01-18 NOTE — H&P (View-Only) (Signed)
Referring Provider: Jani Gravel, MD Primary Care Physician:  Jani Gravel, MD Primary GI: Dr. Abbey Chatters   Chief Complaint  Patient presents with  . Follow-up    fu elevated LFT's    HPI:   Sherry Burgess is a 70 y.o. female presenting today with a history of elevated LFTs, transaminases from Sept 2020 in the 3-400 range. Improvement in transaminases noted in Dec 2020 with AST 173, ALT 136. A1c had improved from the 9 range to 6 as well. Repeat labs most recently in Feb 2021 with AST 214, ALT 230. Negative Hep C, no iron overload. Negative Hep B. Completed Hep A/B vaccinations. Requested labs from May 2021 but did not receive: it appears no recent LFTs done. Felt to be dealing with fatty liver, +/- statin involvement.   No longer on a statin. Myopathies resolved. Feels much improved. No abdominal pain, N/V. No overt GI bleeding. No constipation or diarrhea. No GERD. No dysphagia.    Surveillance colonoscopy due to adenomas due in March 2022.   Past Medical History:  Diagnosis Date  . Asthma   . Diabetes mellitus without complication ([place])   . GERD (gastroesophageal reflux disease)   . Hypercholesterolemia   . Hypertension     Past Surgical History:  Procedure Laterality Date  . COLONOSCOPY N/A 05/16/2017   four simple adenomas, surveillanc 2022  . DILATION AND CURETTAGE OF UTERUS    . OVARY SURGERY    . POLYPECTOMY  05/16/2017   Procedure: POLYPECTOMY;  Surgeon: Danie Binder, MD;  Location: [facility];  Service: Endoscopy;;  cecal x2 ; hepatic flexure x2    Current Outpatient Medications  Medication Sig Dispense Refill  . aspirin EC 81 MG tablet Take 81 mg by mouth daily.    . Cholecalciferol 1000 units tablet Take 2,000 Units by mouth daily.     . fluticasone (FLONASE) 50 MCG/ACT nasal spray Place into both nostrils as needed for allergies or rhinitis.    Marland Kitchen glipiZIDE (GLUCOTROL) 5 MG tablet Take 10 mg by mouth daily.     . hydrochlorothiazide (HYDRODIURIL) 25 MG  tablet Take 25 mg by mouth daily.    Marland Kitchen losartan (COZAAR) 50 MG tablet Take 50 mg by mouth daily.    . metFORMIN (GLUCOPHAGE) 1000 MG tablet Take 1,000 mg by mouth 2 (two) times daily with a meal.    . Multiple Vitamins-Minerals (MULTIVITAMIN WOMENS 50+ ADV PO) Take by mouth daily.     No current facility-administered medications for this visit.    Allergies as of 01/18/2020  . (No Known Allergies)    Family History  Problem Relation Age of Onset  . Colon cancer Paternal Uncle   . Colon polyps Neg Hx     Social History   Socioeconomic History  . Marital status: Married    Spouse name: Not on file  . Number of children: Not on file  . Years of education: Not on file  . Highest education level: Not on file  Occupational History  . Occupation: retired  Tobacco Use  . Smoking status: Former Research scientist (life sciences)  . Smokeless tobacco: Never Used  Substance and Sexual Activity  . Alcohol use: No  . Drug use: Never  . Sexual activity: Not on file  Other Topics Concern  . Not on file  Social History Narrative  . Not on file   Social Determinants of Health   Financial Resource Strain:   . Difficulty of Paying Living Expenses:  Not on file  Food Insecurity:   . Worried About Charity fundraiser in the Last Year: Not on file  . Ran Out of Food in the Last Year: Not on file  Transportation Needs:   . Lack of Transportation (Medical): Not on file  . Lack of Transportation (Non-Medical): Not on file  Physical Activity:   . Days of Exercise per Week: Not on file  . Minutes of Exercise per Session: Not on file  Stress:   . Feeling of Stress : Not on file  Social Connections:   . Frequency of Communication with Friends and Family: Not on file  . Frequency of Social Gatherings with Friends and Family: Not on file  . Attends Religious Services: Not on file  . Active Member of Clubs or Organizations: Not on file  . Attends Archivist Meetings: Not on file  . Marital Status: Not on  file    Review of Systems: Gen: Denies fever, chills, anorexia. Denies fatigue, weakness, weight loss.  CV: Denies chest pain, palpitations, syncope, peripheral edema, and claudication. Resp: Denies dyspnea at rest, cough, wheezing, coughing up blood, and pleurisy. GI: see HPI Derm: Denies rash, itching, dry skin Psych: Denies depression, anxiety, memory loss, confusion. No homicidal or suicidal ideation.  Heme: Denies bruising, bleeding, and enlarged lymph nodes.  Physical Exam: BP (!) 142/77   Pulse 85   Temp (!) 96.9 F (36.1 C) (Temporal)   Ht 5\' 4"  (1.626 m)   Wt 211 lb (95.7 kg)   BMI 36.22 kg/m  General:   Alert and oriented. No distress noted. Pleasant and cooperative.  Head:  Normocephalic and atraumatic. Eyes:  Conjuctiva clear without scleral icterus. Mouth:  Mask in place Abdomen:  +BS, soft, non-tender and non-distended. No rebound or guarding. No HSM or masses noted. Msk:  Symmetrical without gross deformities. Normal posture. Extremities:  Without edema. Neurologic:  Alert and  oriented x4 Psych:  Alert and cooperative. Normal mood and affect.  ASSESSMENT: Sherry Burgess is a 69 y.o. female presenting today with history of elevated LFTs in setting of fatty liver, statin use, now coming off statin per PCP and myopathies resolved. Anticipate LFTs will be much improved at this point. We discussed further serologies if remaining persistently elevated despite cessation of statin.  As she is due for 3 year surveillance colonoscopy due to history of polyps, will go ahead and arrange for near future. No concerning lower GI signs/symptoms.    PLAN:  Recheck HFP now  Proceed with colonoscopy by Dr. Abbey Chatters  in near future: the risks, benefits, and alternatives have been discussed with the patient in detail. The patient states understanding and desires to proceed.   Follow-up to be determined  [name], PhD, ANP-BC [facility]

## 2020-01-18 NOTE — Patient Instructions (Signed)
We are arranging a colonoscopy in the future with Dr. Abbey Chatters. Please don't take metformin or glipizide the morning of the procedure.  Please complete labs when you are able. We will call with results.  We will see you back in 1 year or sooner if needed!   I enjoyed seeing you again today! As you know, I value our relationship and want to provide genuine, compassionate, and quality care. I welcome your feedback. If you receive a survey regarding your visit,  I greatly appreciate you taking time to fill this out. See you next time!  [name], PhD, ANP-BC [facility]

## 2020-01-19 LAB — HEPATIC FUNCTION PANEL
ALT: 76 IU/L — ABNORMAL HIGH (ref 0–32)
AST: 56 IU/L — ABNORMAL HIGH (ref 0–40)
Albumin: 4.7 g/dL (ref 3.8–4.8)
Alkaline Phosphatase: 62 IU/L (ref 44–121)
Bilirubin Total: 0.5 mg/dL (ref 0.0–1.2)
Bilirubin, Direct: 0.14 mg/dL (ref 0.00–0.40)
Total Protein: 7.4 g/dL (ref 6.0–8.5)

## 2020-01-19 NOTE — Progress Notes (Signed)
Cc'ed to pcp °

## 2020-01-20 ENCOUNTER — Other Ambulatory Visit: Payer: Self-pay

## 2020-01-20 DIAGNOSIS — R7989 Other specified abnormal findings of blood chemistry: Secondary | ICD-10-CM

## 2020-01-20 DIAGNOSIS — K760 Fatty (change of) liver, not elsewhere classified: Secondary | ICD-10-CM

## 2020-02-03 ENCOUNTER — Encounter (HOSPITAL_COMMUNITY): Payer: Self-pay

## 2020-02-04 ENCOUNTER — Other Ambulatory Visit: Payer: Self-pay

## 2020-02-04 ENCOUNTER — Other Ambulatory Visit (HOSPITAL_COMMUNITY)
Admission: RE | Admit: 2020-02-04 | Discharge: 2020-02-04 | Disposition: A | Discharge status: Home / Self Care | Payer: Medicare Other | Source: Ambulatory Visit | Attending: Internal Medicine | Admitting: Internal Medicine

## 2020-02-04 DIAGNOSIS — Z01812 Encounter for preprocedural laboratory examination: Secondary | ICD-10-CM | POA: Diagnosis present

## 2020-02-04 DIAGNOSIS — Z20822 Contact with and (suspected) exposure to covid-19: Secondary | ICD-10-CM | POA: Diagnosis not present

## 2020-02-04 LAB — SARS CORONAVIRUS 2 (TAT 6-24 HRS): SARS Coronavirus 2: NEGATIVE

## 2020-02-04 LAB — BASIC METABOLIC PANEL
Anion gap: 10 (ref 5–15)
BUN: 14 mg/dL (ref 8–23)
CO2: 27 mmol/L (ref 22–32)
Calcium: 9.6 mg/dL (ref 8.9–10.3)
Chloride: 101 mmol/L (ref 98–111)
Creatinine, Ser: 0.59 mg/dL (ref 0.44–1.00)
GFR, Estimated: >60 mL/min (ref >60)
Glucose, Bld: 141 mg/dL — ABNORMAL HIGH (ref 70–99)
Potassium: 3.7 mmol/L (ref 3.5–5.1)
Sodium: 138 mmol/L (ref 135–145)

## 2020-02-07 ENCOUNTER — Encounter (HOSPITAL_COMMUNITY)
Admission: RE | Disposition: A | Discharge status: Home / Self Care | Payer: Self-pay | Source: Home / Self Care | Attending: Internal Medicine

## 2020-02-07 ENCOUNTER — Ambulatory Visit (HOSPITAL_COMMUNITY): Payer: Medicare Other | Admitting: Anesthesiology

## 2020-02-07 ENCOUNTER — Encounter (HOSPITAL_COMMUNITY): Payer: Self-pay

## 2020-02-07 ENCOUNTER — Ambulatory Visit (HOSPITAL_COMMUNITY)
Admission: RE | Admit: 2020-02-07 | Discharge: 2020-02-07 | Disposition: A | Discharge status: Home / Self Care | Payer: Medicare Other | Attending: Internal Medicine | Admitting: Internal Medicine

## 2020-02-07 ENCOUNTER — Other Ambulatory Visit: Payer: Self-pay

## 2020-02-07 DIAGNOSIS — Z86010 Personal history of colonic polyps: Secondary | ICD-10-CM | POA: Insufficient documentation

## 2020-02-07 DIAGNOSIS — Z1211 Encounter for screening for malignant neoplasm of colon: Secondary | ICD-10-CM | POA: Diagnosis not present

## 2020-02-07 DIAGNOSIS — K648 Other hemorrhoids: Secondary | ICD-10-CM | POA: Insufficient documentation

## 2020-02-07 DIAGNOSIS — Z79899 Other long term (current) drug therapy: Secondary | ICD-10-CM | POA: Diagnosis not present

## 2020-02-07 DIAGNOSIS — Z87891 Personal history of nicotine dependence: Secondary | ICD-10-CM | POA: Diagnosis not present

## 2020-02-07 DIAGNOSIS — Z7984 Long term (current) use of oral hypoglycemic drugs: Secondary | ICD-10-CM | POA: Diagnosis not present

## 2020-02-07 DIAGNOSIS — K635 Polyp of colon: Secondary | ICD-10-CM

## 2020-02-07 DIAGNOSIS — Z7982 Long term (current) use of aspirin: Secondary | ICD-10-CM | POA: Insufficient documentation

## 2020-02-07 DIAGNOSIS — K5730 Diverticulosis of large intestine without perforation or abscess without bleeding: Secondary | ICD-10-CM | POA: Insufficient documentation

## 2020-02-07 HISTORY — PX: COLONOSCOPY WITH PROPOFOL: SHX5780

## 2020-02-07 HISTORY — PX: POLYPECTOMY: SHX5525

## 2020-02-07 LAB — GLUCOSE, CAPILLARY: Glucose-Capillary: 109 mg/dL — ABNORMAL HIGH (ref 70–99)

## 2020-02-07 SURGERY — COLONOSCOPY WITH PROPOFOL
Anesthesia: General

## 2020-02-07 MED ORDER — STERILE WATER FOR IRRIGATION IR SOLN
Status: DC | PRN
  Administered 2020-02-07: 11:00:00 100 mL

## 2020-02-07 MED ORDER — LIDOCAINE HCL (CARDIAC) PF 100 MG/5ML IV SOSY
PREFILLED_SYRINGE | INTRAVENOUS | Status: DC | PRN
  Administered 2020-02-07: 50 mg via INTRATRACHEAL

## 2020-02-07 MED ORDER — PROPOFOL 10 MG/ML IV BOLUS
INTRAVENOUS | Status: DC | PRN
  Administered 2020-02-07: 100 mg via INTRAVENOUS

## 2020-02-07 MED ORDER — LACTATED RINGERS IV SOLN: INTRAVENOUS | Status: DC | PRN

## 2020-02-07 MED ORDER — LACTATED RINGERS IV SOLN: Freq: Once | INTRAVENOUS | Status: AC

## 2020-02-07 MED ORDER — PROPOFOL 500 MG/50ML IV EMUL
INTRAVENOUS | Status: DC | PRN
  Administered 2020-02-07: 100 ug/kg/min via INTRAVENOUS

## 2020-02-07 NOTE — Transfer of Care (Signed)
Immediate Anesthesia Transfer of Care Note  Patient: Sherry Burgess  Procedure(s) Performed: COLONOSCOPY WITH PROPOFOL (N/A ) POLYPECTOMY  Patient Location: PACU  Anesthesia Type:General  Level of Consciousness: awake, alert  and oriented  Airway & Oxygen Therapy: Patient Spontanous Breathing  Post-op Assessment: Report given to RN, Post -op Vital signs reviewed and stable and Patient moving all extremities X 4  Post vital signs: Reviewed and stable  Last Vitals:  Vitals Value Taken Time  BP    Temp    Pulse    Resp    SpO2      Last Pain:  Vitals:   02/07/20 1110  TempSrc:   PainSc: 0-No pain      Patients Stated Pain Goal: 10 ([birth year] [id number])  Complications: No complications documented.

## 2020-02-07 NOTE — Interval H&P Note (Signed)
History and Physical Interval Note:  02/07/2020 10:27 AM  Sherry Burgess  has presented today for surgery, with the diagnosis of hx polyps.  The various methods of treatment have been discussed with the patient and family. After consideration of risks, benefits and other options for treatment, the patient has consented to  Procedure(s) with comments: COLONOSCOPY WITH PROPOFOL (N/A) - 11:00am as a surgical intervention.  The patient's history has been reviewed, patient examined, no change in status, stable for surgery.  I have reviewed the patient's chart and labs.  Questions were answered to the patient's satisfaction.     Eloise Harman

## 2020-02-07 NOTE — Anesthesia Postprocedure Evaluation (Signed)
Anesthesia Post Note  Patient: Sherry Burgess  Procedure(s) Performed: COLONOSCOPY WITH PROPOFOL (N/A ) POLYPECTOMY  Patient location during evaluation: Endoscopy Anesthesia Type: General Level of consciousness: awake and alert Pain management: pain level controlled Vital Signs Assessment: post-procedure vital signs reviewed and stable Respiratory status: spontaneous breathing, nonlabored ventilation, respiratory function stable and patient connected to nasal cannula oxygen Cardiovascular status: blood pressure returned to baseline and stable Postop Assessment: no apparent nausea or vomiting Anesthetic complications: no   No complications documented.   Last Vitals:  Vitals:   02/07/20 1006 02/07/20 1136  BP: 135/74 (!) 104/48  Pulse: 80 73  Resp: 18 (!) 22  Temp: 36.7 C 36.7 C  SpO2: 100% 100%    Last Pain:  Vitals:   02/07/20 1136  TempSrc: Oral  PainSc: 0-No pain                 Talitha Givens

## 2020-02-07 NOTE — Op Note (Signed)
[facility] Patient Name: Sherry Burgess Procedure Date: 02/07/2020 11:05 AM MRN: [id number] Date of Birth: 1950-08-25 Attending MD: Elon Alas. Abbey Chatters DO CSN: [id number] Age: 69 Admit Type: Outpatient Procedure:                Colonoscopy Indications:              High risk colon cancer surveillance: Personal                            history of colonic polyps Providers:                Elon Alas. Abbey Chatters, DO, Lambert Mody, Tacy Learn, Technician Referring MD:              Medicines:                See the Anesthesia note for documentation of the                            administered medications Complications:            No immediate complications. Estimated Blood Loss:     Estimated blood loss was minimal. Procedure:                Pre-Anesthesia Assessment:                           - The anesthesia plan was to use monitored                            anesthesia care (MAC).                           After obtaining informed consent, the colonoscope                            was passed under direct vision. Throughout the                            procedure, the patient's blood pressure, pulse, and                            oxygen saturations were monitored continuously. The                            [id number]-[id number] ([id number]) scope was introduced through                            the anus and advanced to the the cecum, identified                            by appendiceal orifice and ileocecal valve. The                            colonoscopy was technically difficult and complex  due to a redundant colon and significant looping.                            Successful completion of the procedure was aided by                            applying abdominal pressure. The patient tolerated                            the procedure well. The quality of the bowel                            preparation was evaluated  using the BBPS [facility]                            Bowel Preparation Scale) with scores of: Right                            Colon = 2 (minor amount of residual staining, small                            fragments of stool and/or opaque liquid, but mucosa                            seen well), Transverse Colon = 2 (minor amount of                            residual staining, small fragments of stool and/or                            opaque liquid, but mucosa seen well) and Left Colon                            = 3 (entire mucosa seen well with no residual                            staining, small fragments of stool or opaque                            liquid). The total BBPS score equals 7. The quality                            of the bowel preparation was fair. Scope In: 11:14:40 AM Scope Out: 11:32:41 AM Scope Withdrawal Time: 0 hours 11 minutes 38 seconds  Total Procedure Duration: 0 hours 18 minutes 1 second  Findings:      The perianal and digital rectal examinations were normal.      Non-bleeding internal hemorrhoids were found during endoscopy.      Multiple small-mouthed diverticula were found in the sigmoid colon and       descending colon.      A 2 mm polyp was found in the transverse colon. The polyp was sessile.       The polyp  was removed with a cold biopsy forceps. Resection and       retrieval were complete. Impression:               - Preparation of the colon was fair.                           - Non-bleeding internal hemorrhoids.                           - Diverticulosis in the sigmoid colon and in the                            descending colon.                           - One 2 mm polyp in the transverse colon, removed                            with a cold biopsy forceps. Resected and retrieved. Moderate Sedation:      Per Anesthesia Care Recommendation:           - Patient has a contact number available for                            emergencies. The signs and  symptoms of potential                            delayed complications were discussed with the                            patient. Return to normal activities tomorrow.                            Written discharge instructions were provided to the                            patient.                           - Resume previous diet.                           - Continue present medications.                           - Await pathology results.                           - Repeat colonoscopy in 5 years for surveillance.                           - Return to GI clinic in 3 months. Procedure Code(s):        --- Professional ---                           [phone], Colonoscopy, flexible; with biopsy, single  or multiple Diagnosis Code(s):        --- Professional ---                           Z86.010, Personal history of colonic polyps                           K64.8, Other hemorrhoids                           K63.5, Polyp of colon                           K57.30, Diverticulosis of large intestine without                            perforation or abscess without bleeding CPT copyright 2019 American Medical Association. All rights reserved. The codes documented in this report are preliminary and upon coder review may  be revised to meet current compliance requirements. Elon Alas. Abbey Chatters, DO [place] Abbey Chatters, DO 02/07/2020 11:37:39 AM This report has been signed electronically. Number of Addenda: 0

## 2020-02-07 NOTE — Discharge Instructions (Addendum)
Colonoscopy Discharge Instructions  Read the instructions outlined below and refer to this sheet in the next few weeks. These discharge instructions provide you with general information on caring for yourself after you leave the hospital. Your doctor may also give you specific instructions. While your treatment has been planned according to the most current medical practices available, unavoidable complications occasionally occur.   ACTIVITY  You may resume your regular activity, but move at a slower pace for the next 24 hours.   Take frequent rest periods for the next 24 hours.   Walking will help get rid of the air and reduce the bloated feeling in your belly (abdomen).   No driving for 24 hours (because of the medicine (anesthesia) used during the test).    Do not sign any important legal documents or operate any machinery for 24 hours (because of the anesthesia used during the test).  NUTRITION  Drink plenty of fluids.   You may resume your normal diet as instructed by your doctor.   Begin with a light meal and progress to your normal diet. Heavy or fried foods are harder to digest and may make you feel sick to your stomach (nauseated).   Avoid alcoholic beverages for 24 hours or as instructed.  MEDICATIONS  You may resume your normal medications unless your doctor tells you otherwise.  WHAT YOU CAN EXPECT TODAY  Some feelings of bloating in the abdomen.   Passage of more gas than usual.   Spotting of blood in your stool or on the toilet paper.  IF YOU HAD POLYPS REMOVED DURING THE COLONOSCOPY:  No aspirin products for 7 days or as instructed.   No alcohol for 7 days or as instructed.   Eat a soft diet for the next 24 hours.  FINDING OUT THE RESULTS OF YOUR TEST Not all test results are available during your visit. If your test results are not back during the visit, make an appointment with your caregiver to find out the results. Do not assume everything is normal if  you have not heard from your caregiver or the medical facility. It is important for you to follow up on all of your test results.  SEEK IMMEDIATE MEDICAL ATTENTION IF:  You have more than a spotting of blood in your stool.   Your belly is swollen (abdominal distention).   You are nauseated or vomiting.   You have a temperature over 101.   You have abdominal pain or discomfort that is severe or gets worse throughout the day.   Your colonoscopy revealed 1 polyp(s) which I removed successfully. Await pathology results, my office will contact you. I recommend repeating colonoscopy in 5 years for surveillance purposes.   Follow up with GI in 3 months or sooner if needed.   I hope you have a great rest of your week!  Elon Alas. Abbey Chatters, D.O. Gastroenterology and Hepatology [facility]    Colon Polyps  Polyps are tissue growths inside the body. Polyps can grow in many places, including the large intestine (colon). A polyp may be a round bump or a mushroom-shaped growth. You could have one polyp or several. Most colon polyps are noncancerous (benign). However, some colon polyps can become cancerous over time. Finding and removing the polyps early can help prevent this. What are the causes? The exact cause of colon polyps is not known. What increases the risk? You are more likely to develop this condition if you:  Have a family history of  colon cancer or colon polyps.  Are older than 37 or older than 45 if you are African American.  Have inflammatory bowel disease, such as ulcerative colitis or Crohn's disease.  Have certain hereditary conditions, such as: ? Familial adenomatous polyposis. ? Lynch syndrome. ? Turcot syndrome. ? Peutz-Jeghers syndrome.  Are overweight.  Smoke cigarettes.  Do not get enough exercise.  Drink too much alcohol.  Eat a diet that is high in fat and red meat and low in fiber.  Had childhood cancer that was treated  with abdominal radiation. What are the signs or symptoms? Most polyps do not cause symptoms. If you have symptoms, they may include:  Blood coming from your rectum when having a bowel movement.  Blood in your stool. The stool may look dark red or black.  Abdominal pain.  A change in bowel habits, such as constipation or diarrhea. How is this diagnosed? This condition is diagnosed with a colonoscopy. This is a procedure in which a lighted, flexible scope is inserted into the anus and then passed into the colon to examine the area. Polyps are sometimes found when a colonoscopy is done as part of routine cancer screening tests. How is this treated? Treatment for this condition involves removing any polyps that are found. Most polyps can be removed during a colonoscopy. Those polyps will then be tested for cancer. Additional treatment may be needed depending on the results of testing. Follow these instructions at home: Lifestyle  Maintain a healthy weight, or lose weight if recommended by your health care provider.  Exercise every day or as told by your health care provider.  Do not use any products that contain nicotine or tobacco, such as cigarettes and e-cigarettes. If you need help quitting, ask your health care provider.  If you drink alcohol, limit how much you have: ? 0-1 drink a day for women. ? 0-2 drinks a day for men.  Be aware of how much alcohol is in your drink. In the [place] one drink equals one 12 oz bottle of beer (355 mL), one 5 oz glass of wine (148 mL), or one 1 oz shot of hard liquor (44 mL). Eating and drinking   Eat foods that are high in fiber, such as fruits, vegetables, and whole grains.  Eat foods that are high in calcium and vitamin D, such as milk, cheese, yogurt, eggs, liver, fish, and broccoli.  Limit foods that are high in fat, such as fried foods and desserts.  Limit the amount of red meat and processed meat you eat, such as hot dogs, sausage,  bacon, and lunch meats. General instructions  Keep all follow-up visits as told by your health care provider. This is important. ? This includes having regularly scheduled colonoscopies. ? Talk to your health care provider about when you need a colonoscopy. Contact a health care provider if:  You have new or worsening bleeding during a bowel movement.  You have new or increased blood in your stool.  You have a change in bowel habits.  You lose weight for no known reason. Summary  Polyps are tissue growths inside the body. Polyps can grow in many places, including the colon.  Most colon polyps are noncancerous (benign), but some can become cancerous over time.  This condition is diagnosed with a colonoscopy.  Treatment for this condition involves removing any polyps that are found. Most polyps can be removed during a colonoscopy. This information is not intended to replace advice given to you by  your health care provider. Make sure you discuss any questions you have with your health care provider. Document Revised: 05/22/2017 Document Reviewed: 05/22/2017 Elsevier Patient Education  [place] These instructions provide you with information about caring for yourself after your procedure. Your health care provider may also give you more specific instructions. Your treatment has been planned according to current medical practices, but problems sometimes occur. Call your health care provider if you have any problems or questions after your procedure. What can I expect after the procedure? After your procedure, you may:  Feel sleepy for several hours.  Feel clumsy and have poor balance for several hours.  Feel forgetful about what happened after the procedure.  Have poor judgment for several hours.  Feel nauseous or vomit.  Have a sore throat if you had a breathing tube during the procedure. Follow these instructions at  home: For at least 24 hours after the procedure:      Have a responsible adult stay with you. It is important to have someone help care for you until you are awake and alert.  Rest as needed.  Do not: ? Participate in activities in which you could fall or become injured. ? Drive. ? Use heavy machinery. ? Drink alcohol. ? Take sleeping pills or medicines that cause drowsiness. ? Make important decisions or sign legal documents. ? Take care of children on your own. Eating and drinking  Follow the diet that is recommended by your health care provider.  If you vomit, drink water, juice, or soup when you can drink without vomiting.  Make sure you have little or no nausea before eating solid foods. General instructions  Take over-the-counter and prescription medicines only as told by your health care provider.  If you have sleep apnea, surgery and certain medicines can increase your risk for breathing problems. Follow instructions from your health care provider about wearing your sleep device: ? Anytime you are sleeping, including during daytime naps. ? While taking prescription pain medicines, sleeping medicines, or medicines that make you drowsy.  If you smoke, do not smoke without supervision.  Keep all follow-up visits as told by your health care provider. This is important. Contact a health care provider if:  You keep feeling nauseous or you keep vomiting.  You feel light-headed.  You develop a rash.  You have a fever. Get help right away if:  You have trouble breathing. Summary  For several hours after your procedure, you may feel sleepy and have poor judgment.  Have a responsible adult stay with you for at least 24 hours or until you are awake and alert. This information is not intended to replace advice given to you by your health care provider. Make sure you discuss any questions you have with your health care provider. Document Revised: 05/05/2017 Document  Reviewed: 05/28/2015 Elsevier Patient Education  [place].

## 2020-02-07 NOTE — Anesthesia Preprocedure Evaluation (Addendum)
Anesthesia Evaluation  Patient identified by MRN, date of birth, ID band Patient awake    Reviewed: Allergy & Precautions, NPO status , Patient's Chart, lab work & pertinent test results  History of Anesthesia Complications Negative for: history of anesthetic complications  Airway Mallampati: II  TM Distance: >3 FB Neck ROM: Full    Dental  (+) Dental Advisory Given, Partial Upper   Pulmonary asthma , former smoker,    Pulmonary exam normal breath sounds clear to auscultation       Cardiovascular Exercise Tolerance: Good hypertension, Pt. on medications Normal cardiovascular exam Rhythm:Regular Rate:Normal     Neuro/Psych negative neurological ROS  negative psych ROS   GI/Hepatic Neg liver ROS, GERD  Medicated and Controlled,  Endo/Other  diabetes, Well Controlled, Type 2, Oral Hypoglycemic Agents  Renal/GU negative Renal ROS  negative genitourinary   Musculoskeletal negative musculoskeletal ROS (+)   Abdominal   Peds  Hematology negative hematology ROS (+)   Anesthesia Other Findings   Reproductive/Obstetrics negative OB ROS                            Anesthesia Physical Anesthesia Plan  ASA: II  Anesthesia Plan: General   Post-op Pain Management:    Induction: Intravenous  PONV Risk Score and Plan: TIVA  Airway Management Planned: Nasal Cannula and Natural Airway  Additional Equipment:   Intra-op Plan:   Post-operative Plan:   Informed Consent: I have reviewed the patients History and Physical, chart, labs and discussed the procedure including the risks, benefits and alternatives for the proposed anesthesia with the patient or authorized representative who has indicated his/her understanding and acceptance.     Dental advisory given  Plan Discussed with: CRNA and Surgeon  Anesthesia Plan Comments:         Anesthesia Quick Evaluation

## 2020-02-09 LAB — SURGICAL PATHOLOGY

## 2020-02-15 ENCOUNTER — Encounter (HOSPITAL_COMMUNITY): Payer: Self-pay | Admitting: Internal Medicine

## 2020-03-22 IMAGING — US US ABDOMEN COMPLETE
1 series · 14 of 25 positions shown · non-contrast
Comparison: None.

CLINICAL DATA: Elevated liver function tests.

EXAM:
ABDOMEN ULTRASOUND COMPLETE

[Series 1: us abdomen complete · 0.20mm/px · 14 of 174 slices shown]
[im 1/174]
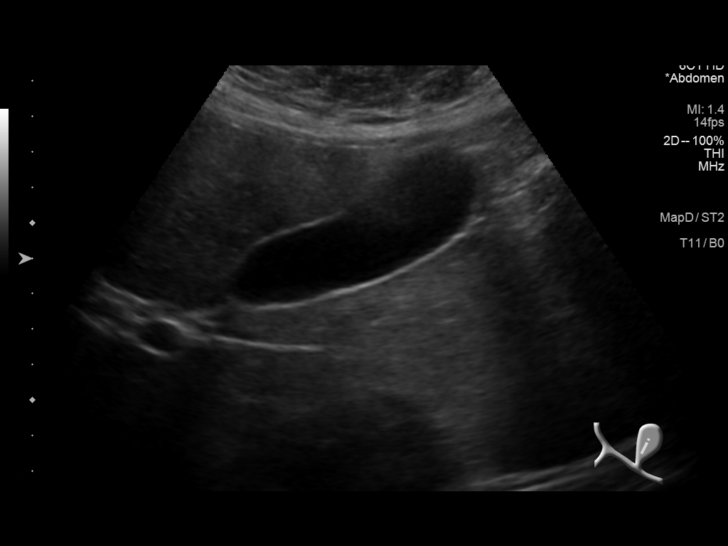
[im 15/174]
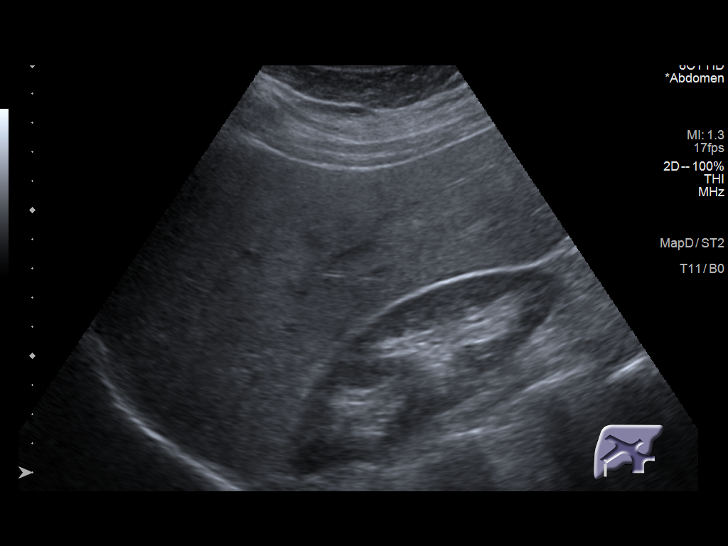
[im 29/174]
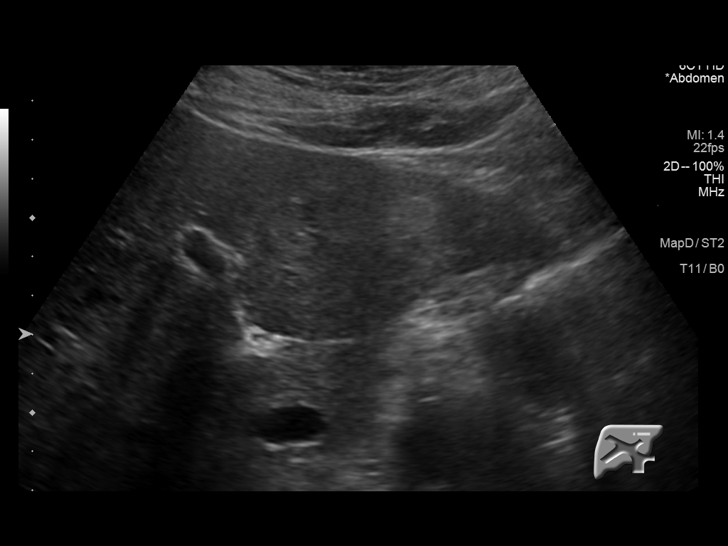
[im 44/174]
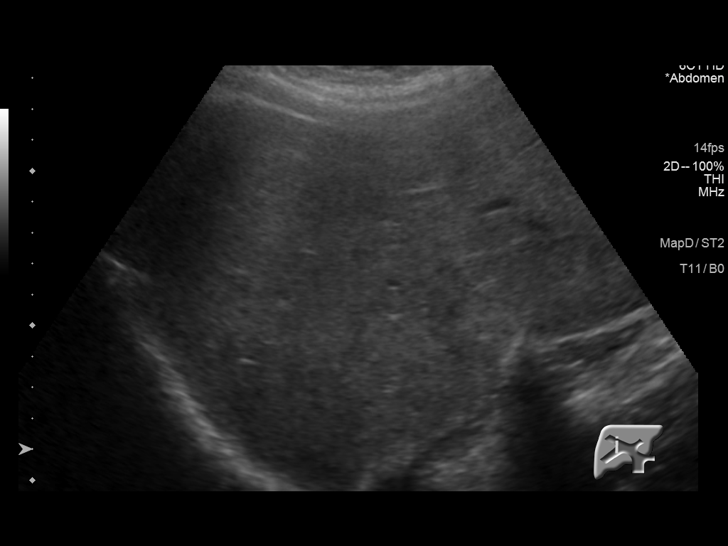
[im 58/174]
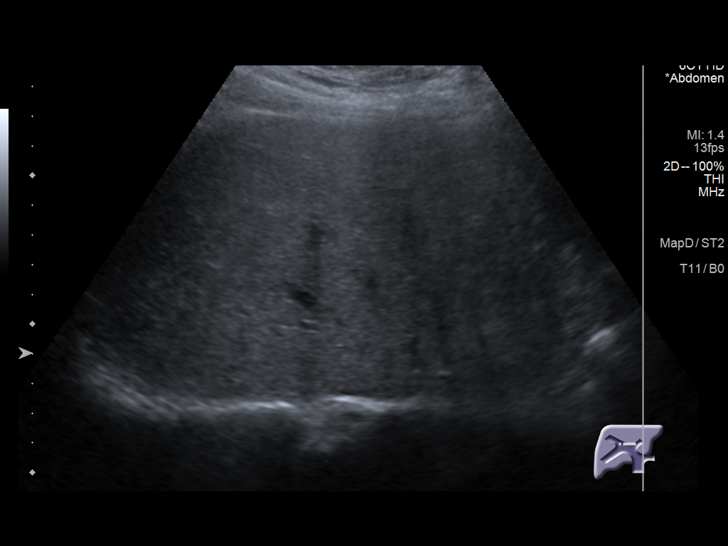
[im 65/174]
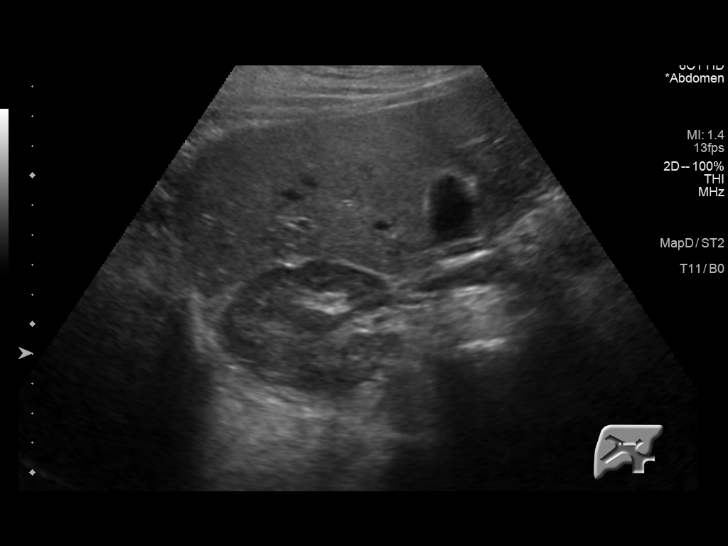
[im 80/174]
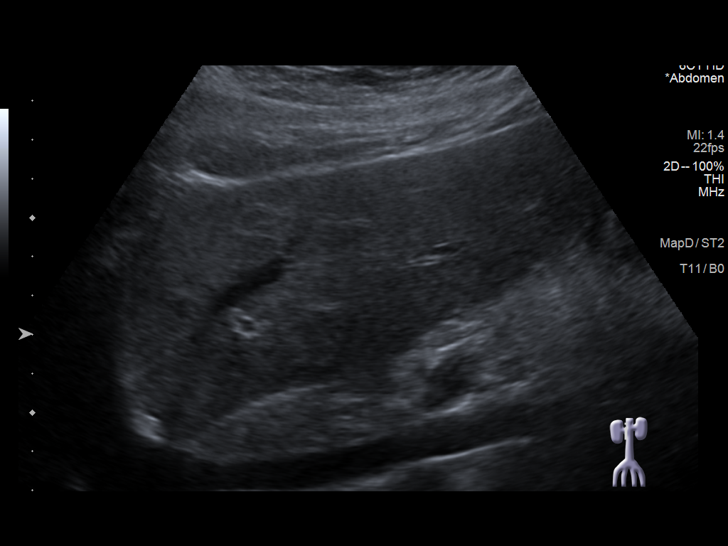
[im 94/174]
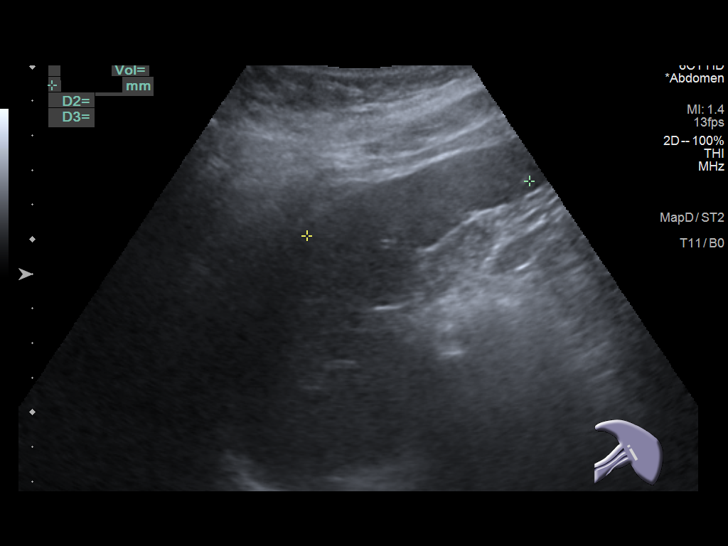
[im 109/174]
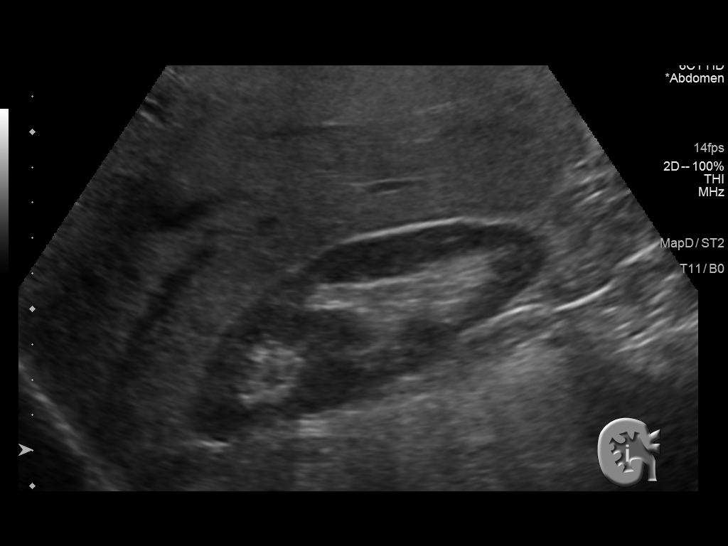
[im 116/174]
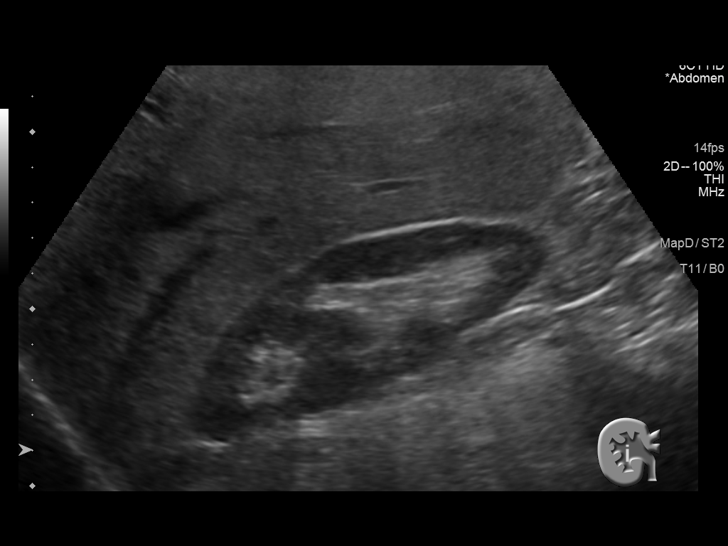
[im 130/174]
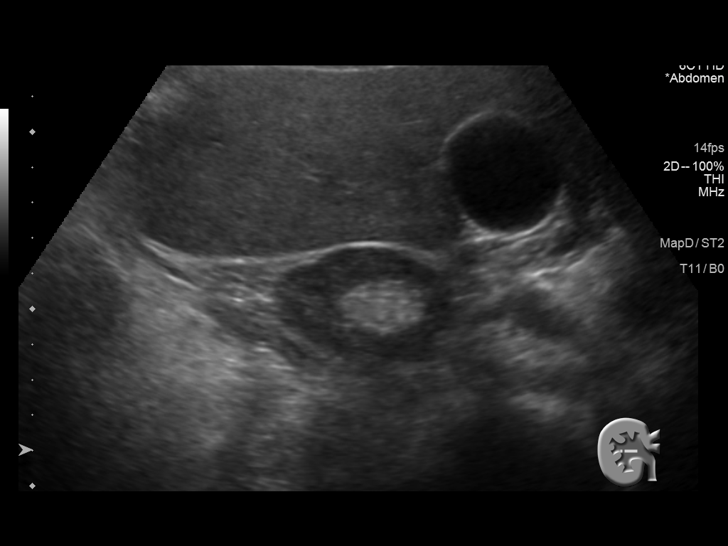
[im 145/174]
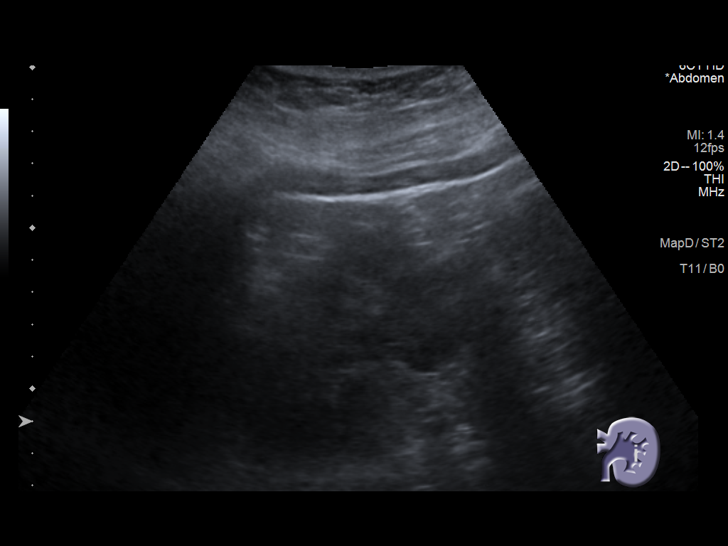
[im 159/174]
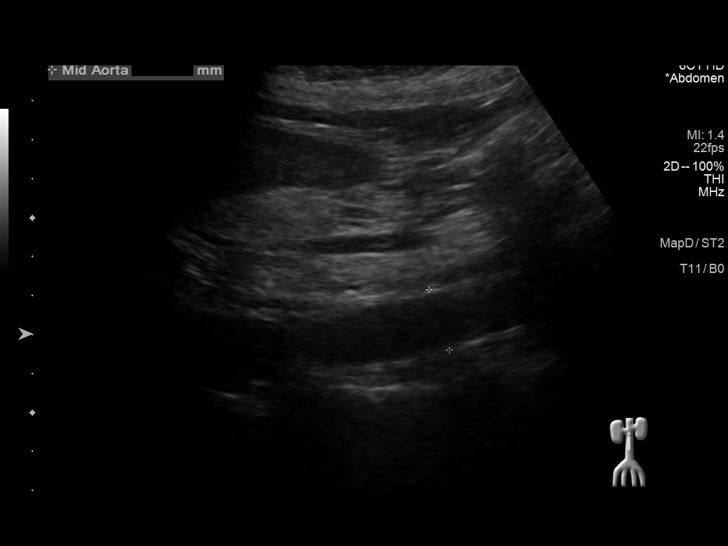
[im 174/174]
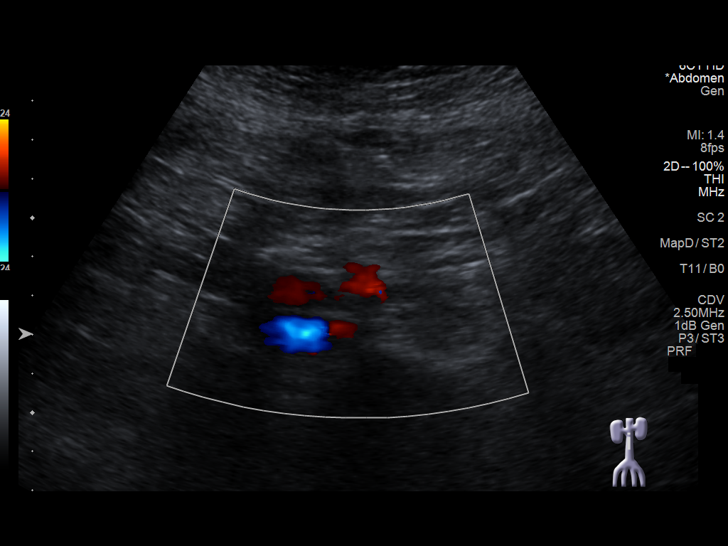

[14 of 25 positions shown; findings below may reference images not displayed]

FINDINGS: Gallbladder: No gallstones or wall thickening visualized. No
sonographic Murphy sign noted by sonographer.

Common bile duct: Diameter: 4 mm, within normal limits.

Liver: Mildly increased echogenicity of the hepatic parenchyma,
consistent with hepatic steatosis. No hepatic mass identified.
Portal vein is patent on color Doppler imaging with normal direction
of blood flow towards the liver.

IVC: No abnormality visualized.

Pancreas: Visualized portion unremarkable.

Spleen: Size and appearance within normal limits.

Right Kidney: Length: 11.2 cm. Echogenicity within normal limits. No
mass or hydronephrosis visualized.

Left Kidney: Length: 11.4 cm. Echogenicity within normal limits. No
mass or hydronephrosis visualized.

Abdominal aorta: No aneurysm visualized.

Other findings: None.
IMPRESSION: Mild diffuse hepatic steatosis.

No evidence of cholelithiasis, biliary ductal dilatation, or other
significant abnormality.

## 2020-05-09 ENCOUNTER — Encounter: Payer: Self-pay | Admitting: Gastroenterology

## 2020-05-09 ENCOUNTER — Ambulatory Visit: Payer: Medicare Other | Admitting: Gastroenterology

## 2020-05-09 ENCOUNTER — Other Ambulatory Visit: Payer: Self-pay

## 2020-05-09 VITALS — BP 166/77 | HR 80 | Temp 96.2°F | Ht 64.0 in | Wt 211.4 lb

## 2020-05-09 DIAGNOSIS — Z86010 Personal history of colonic polyps: Secondary | ICD-10-CM | POA: Diagnosis not present

## 2020-05-09 DIAGNOSIS — R7989 Other specified abnormal findings of blood chemistry: Secondary | ICD-10-CM

## 2020-05-09 NOTE — Progress Notes (Signed)
CC'ED TO PCP 

## 2020-05-09 NOTE — Patient Instructions (Signed)
Please have blood work done, and we will call with the results.  I am glad you are doing better!  Your next colonoscopy will be in 2026.  We will see you back in 1 year or sooner if needed!  I enjoyed seeing you again today! As you know, I value our relationship and want to provide genuine, compassionate, and quality care. I welcome your feedback. If you receive a survey regarding your visit,  I greatly appreciate you taking time to fill this out. See you next time!  [name], PhD, ANP-BC [facility] Gastroenterology

## 2020-05-09 NOTE — Progress Notes (Signed)
Referring Provider: Jani Gravel, MD Primary Care Physician:  Jani Gravel, MD  Chief Complaint  Patient presents with   elevated lft's    F/u    HPI:   Sherry Burgess is a 70 y.o. female presenting today with a history of elevated LFTs, transaminases from Sept 2020 in the 3-400 range. Improvement in transaminases noted in Dec 2020 with AST 173, ALT 136. A1c had improved from the 9 range to 6 as well. Repeat labs most recently in Feb 2021 with AST 214, ALT 230. Negative Hep C, no iron overload. Negative Hep B. Completed Hep A/B vaccinations. Felt to be dealing with fatty liver +/- statin involvement. Transaminases much improved by Nov 2021 with ALT 76 and AST 56 once off statin.  Due for repeat now.   History of adenomas with most recent colonoscopy 01/2020: prep of colon was fair. Non-bleeding internal hemorrhoids. Sigmoid and descending colon diverticulosis, one 2 mm polyp in transverse colon. Tubular adenoma. Surveillance 5 years.   Takes omeprazole as needed. Added Benefiber to diet as she gets sluggish at times. Husband has been doing this as well. BM usually daily to every other day. No overt GI bleeding. Feels great.   Past Medical History:  Diagnosis Date   Asthma    Diabetes mellitus without complication (HCC)    GERD (gastroesophageal reflux disease)    Hypercholesterolemia    Hypertension     Past Surgical History:  Procedure Laterality Date   COLONOSCOPY N/A 05/16/2017   four simple adenomas, surveillanc 2022   COLONOSCOPY WITH PROPOFOL N/A 02/07/2020    prep of colon was fair. Non-bleeding internal hemorrhoids. Sigmoid and descending colon diverticulosis, one 2 mm polyp in transverse colon. Tubular adenoma. Surveillance 5 years.    DILATION AND CURETTAGE OF UTERUS     OVARY SURGERY     POLYPECTOMY  05/16/2017   Procedure: POLYPECTOMY;  Surgeon: Danie Binder, MD;  [facility];  Service: Endoscopy;;  cecal x2 ; hepatic flexure x2    POLYPECTOMY  02/07/2020   Procedure: POLYPECTOMY;  Surgeon: Eloise Harman, DO;  [facility];  Service: Endoscopy;;    Current Outpatient Medications  Medication Sig Dispense Refill   aspirin EC 81 MG tablet Take 81 mg by mouth daily.     Cholecalciferol (VITAMIN D3) 50 MCG (2000 UT) TABS Take 2,000 Units by mouth daily.     Coenzyme Q10 (CO Q 10 PO) Take by mouth daily.     fluticasone (FLONASE) 50 MCG/ACT nasal spray Place into both nostrils as needed for allergies or rhinitis.     glipiZIDE (GLUCOTROL XL) 10 MG 24 hr tablet Take 10 mg by mouth daily.     hydrochlorothiazide (HYDRODIURIL) 25 MG tablet Take 25 mg by mouth daily.     losartan (COZAAR) 50 MG tablet Take 50 mg by mouth daily.     metFORMIN (GLUCOPHAGE) 1000 MG tablet Take 1,000 mg by mouth 2 (two) times daily with a meal.     Multiple Vitamin (MULTIVITAMIN WITH MINERALS) TABS tablet Take 1 tablet by mouth daily.     naproxen sodium (ALEVE) 220 MG tablet Take 440 mg by mouth daily as needed (pain.).     omeprazole (PRILOSEC) 10 MG capsule Take 10 mg by mouth daily as needed.     No current facility-administered medications for this visit.    Allergies as of 05/09/2020 - Review Complete 05/09/2020  Allergen Reaction Noted   Statins  01/18/2020    Family History  Problem Relation Age of Onset   Colon cancer Paternal Uncle    Colon polyps Neg Hx     Social History   Socioeconomic History   Marital status: Married    Spouse name: Not on file   Number of children: Not on file   Years of education: Not on file   Highest education level: Not on file  Occupational History   Occupation: retired  Tobacco Use   Smoking status: Former Smoker   Smokeless tobacco: Never Used  Scientific laboratory technician Use: Never used  Substance and Sexual Activity   Alcohol use: No   Drug use: Never   Sexual activity: Not on file  Other Topics Concern   Not on file  Social History Narrative    Not on file   Social Determinants of Health   Financial Resource Strain: Not on file  Food Insecurity: Not on file  Transportation Needs: Not on file  Physical Activity: Not on file  Stress: Not on file  Social Connections: Not on file    Review of Systems: Gen: Denies fever, chills, anorexia. Denies fatigue, weakness, weight loss.  CV: Denies chest pain, palpitations, syncope, peripheral edema, and claudication. Resp: Denies dyspnea at rest, cough, wheezing, coughing up blood, and pleurisy. GI: see HPI Derm: Denies rash, itching, dry skin Psych: Denies depression, anxiety, memory loss, confusion. No homicidal or suicidal ideation.  Heme: Denies bruising, bleeding, and enlarged lymph nodes.  Physical Exam: BP (!) 166/77    Pulse 80    Temp (!) 96.2 F (35.7 C) (Temporal)    Ht 5\' 4"  (1.626 m)    Wt 211 lb 6.4 oz (95.9 kg)    BMI 36.29 kg/m  General:   Alert and oriented. No distress noted. Pleasant and cooperative.  Head:  Normocephalic and atraumatic. Eyes:  Conjuctiva clear without scleral icterus. Mouth:  Mask in place Abdomen:  +BS, soft, non-tender and non-distended. No rebound or guarding. No HSM or masses noted. Msk:  Symmetrical without gross deformities. Normal posture. Extremities:  Without edema. Neurologic:  Alert and  oriented x4 Psych:  Alert and cooperative. Normal mood and affect.  ASSESSMENT: Sherry Burgess is a 70 y.o. female presenting today with history of elevated transaminases most likely due to statin use +/- fatty liver, with notable improvement once cessation. Due for repeat HFP now.   Recent colonoscopy completed and due for surveillance in 2026.   GERD well-managed with just as needed PPI, which is rare.   PLAN:  Recheck HFP now If doing well, will see her back in 1 year Colonoscopy 2026  [name], PhD, [facility] Gastroenterology

## 2020-12-26 ENCOUNTER — Encounter: Payer: Self-pay | Admitting: Internal Medicine

## 2021-05-10 ENCOUNTER — Encounter: Payer: Self-pay | Admitting: Internal Medicine

## 2021-07-25 ENCOUNTER — Ambulatory Visit: Payer: Medicare Other | Admitting: Gastroenterology

## 2021-09-27 ENCOUNTER — Ambulatory Visit (INDEPENDENT_AMBULATORY_CARE_PROVIDER_SITE_OTHER): Payer: Medicare Other | Admitting: Gastroenterology

## 2021-09-27 ENCOUNTER — Encounter: Payer: Self-pay | Admitting: Gastroenterology

## 2021-09-27 VITALS — BP 137/80 | HR 100 | Temp 98.2°F | Ht 63.0 in | Wt 205.4 lb

## 2021-09-27 DIAGNOSIS — K760 Fatty (change of) liver, not elsewhere classified: Secondary | ICD-10-CM | POA: Diagnosis not present

## 2021-09-27 NOTE — Patient Instructions (Signed)
Please have blood work done today. If liver numbers remain significantly elevated, I will order additional labs.  Will see you in 6 months!  Have a wonderful rest of the summer!  I enjoyed seeing you again today! As you know, I value our relationship and want to provide genuine, compassionate, and quality care. I welcome your feedback. If you receive a survey regarding your visit,  I greatly appreciate you taking time to fill this out. See you next time!  [name], PhD, ANP-BC [facility] Gastroenterology

## 2021-09-27 NOTE — Progress Notes (Signed)
Gastroenterology Office Note     Primary Care Physician:  Jani Gravel, MD  Primary Gastroenterologist: Dr. Abbey Chatters   Chief Complaint   Chief Complaint  Patient presents with   Follow-up    Doing well, still needs to do labs.      History of Present Illness   Sherry Burgess is a 71 y.o. female presenting today in follow-up with a history of  elevated LFTs, transaminases from Sept 2020 in the 3-400 range. Improvement in transaminases noted in Dec 2020 with AST 173, ALT 136. A1c had improved from the 9 range to 6 as well. Repeat labs Feb 2021 with AST 214, ALT 230. Negative Hep C, no iron overload. Negative Hep B. Completed Hep A/B vaccinations. Felt to be dealing with fatty liver +/- statin involvement. Transaminases much improved by Nov 2021 with ALT 76 and AST 56 once off statin   History of adenomas with most recent colonoscopy 01/2020: prep of colon was fair. Non-bleeding internal hemorrhoids. Sigmoid and descending colon diverticulosis, one 2 mm polyp in transverse colon. Tubular adenoma. Surveillance 5 years  Presents today for labs. Her adopted son underwent heart transplant last year after sudden collapse while playing football. 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 He will be a senior this year.   Blood sugars have not been controlled but working with PCP on this. No overt GI bleeding. GERD controlled. No issues with constipation.   Past Medical History:  Diagnosis Date   Asthma    Diabetes mellitus without complication (HCC)    GERD (gastroesophageal reflux disease)    Hypercholesterolemia    Hypertension     Past Surgical History:  Procedure Laterality Date   COLONOSCOPY N/A 05/16/2017   four simple adenomas, surveillanc 2022   COLONOSCOPY WITH PROPOFOL N/A 02/07/2020    prep of colon was fair. Non-bleeding internal hemorrhoids. Sigmoid and descending colon diverticulosis, one 2 mm polyp in transverse colon. Tubular adenoma. Surveillance 5 years.     DILATION AND CURETTAGE OF UTERUS     OVARY SURGERY     POLYPECTOMY  05/16/2017   Procedure: POLYPECTOMY;  Surgeon: Danie Binder, MD;  [facility];  Service: Endoscopy;;  cecal x2 ; hepatic flexure x2   POLYPECTOMY  02/07/2020   Procedure: POLYPECTOMY;  Surgeon: Eloise Harman, DO;  [facility];  Service: Endoscopy;;    Current Outpatient Medications  Medication Sig Dispense Refill   Acetaminophen 325 MG CAPS Take 325 mg by mouth daily as needed.     aspirin EC 81 MG tablet Take 81 mg by mouth daily.     Cholecalciferol (VITAMIN D3) 50 MCG (2000 UT) TABS Take 2,000 Units by mouth daily.     fluticasone (FLONASE) 50 MCG/ACT nasal spray Place into both nostrils as needed for allergies or rhinitis.     glipiZIDE (GLUCOTROL XL) 10 MG 24 hr tablet Take 10 mg by mouth daily.     hydrochlorothiazide (HYDRODIURIL) 25 MG tablet Take 25 mg by mouth daily.     LEVEMIR FLEXPEN 100 UNIT/ML FlexPen Inject 30 Units into the skin daily.     losartan (COZAAR) 50 MG tablet Take 50 mg by mouth daily.     metFORMIN (GLUCOPHAGE) 1000 MG tablet Take 1,000 mg by mouth daily.     Multiple Vitamin (MULTIVITAMIN WITH MINERALS) TABS tablet Take 1 tablet by mouth daily.     omeprazole (PRILOSEC) 10 MG capsule Take 10 mg by mouth daily  as needed.     gemfibrozil (LOPID) 600 MG tablet Take 600 mg by mouth 2 (two) times daily. (Patient not taking: Reported on 09/27/2021)     No current facility-administered medications for this visit.    Allergies as of 09/27/2021 - Review Complete 09/27/2021  Allergen Reaction Noted   Statins  01/18/2020    Family History  Problem Relation Age of Onset   Colon cancer Paternal Uncle    Colon polyps Neg Hx     Social History   Socioeconomic History   Marital status: Married    Spouse name: Not on file   Number of children: Not on file   Years of education: Not on file   Highest education level: Not on file  Occupational History   Occupation:  retired  Tobacco Use   Smoking status: Former   Smokeless tobacco: Never  Scientific laboratory technician Use: Never used  Substance and Sexual Activity   Alcohol use: No   Drug use: Never   Sexual activity: Not Currently  Other Topics Concern   Not on file  Social History Narrative   Not on file   Social Determinants of Health   Financial Resource Strain: Not on file  Food Insecurity: Not on file  Transportation Needs: Not on file  Physical Activity: Not on file  Stress: Not on file  Social Connections: Not on file  Intimate Partner Violence: Not on file     Review of Systems   Gen: Denies any fever, chills, fatigue, weight loss, lack of appetite.  CV: Denies chest pain, heart palpitations, peripheral edema, syncope.  Resp: Denies shortness of breath at rest or with exertion. Denies wheezing or cough.  GI: see HPI GU : Denies urinary burning, urinary frequency, urinary hesitancy MS: Denies joint pain, muscle weakness, cramps, or limitation of movement.  Derm: Denies rash, itching, dry skin Psych: Denies depression, anxiety, memory loss, and confusion Heme: Denies bruising, bleeding, and enlarged lymph nodes.   Physical Exam   BP 137/80 (BP Location: Left Arm, Patient Position: Sitting, Cuff Size: Large)   Pulse 100   Temp 98.2 F (36.8 C) (Oral)   Ht '5\' 3"'$  (1.6 m)   Wt 205 lb 6.4 oz (93.2 kg)   SpO2 100%   BMI 36.38 kg/m  General:   Alert and oriented. Pleasant and cooperative. Well-nourished and well-developed.  Head:  Normocephalic and atraumatic. Eyes:  Without icterus Abdomen:  +BS, soft, non-tender and non-distended. No HSM noted. No guarding or rebound. No masses appreciated.  Rectal:  Deferred  Msk:  Symmetrical without gross deformities. Normal posture. Extremities:  Without edema. Neurologic:  Alert and  oriented x4;  grossly normal neurologically. Skin:  Intact without significant lesions or rashes. Psych:  Alert and cooperative. Normal mood and  affect.   Assessment   Sherry Burgess is a 71 y.o. female presenting today in follow-up with a history of elevated LFTS in setting of hepatic steatosis while on statin; this improved following discontinuation of statin but did not fully resolve.  She hasn't been seen in a year due to her son undergoing heart transplant. We will update HFP today. If remain elevated, recommend extensive serologies. She has been prescribed gemfibrozil but has held off on taking this. I discussed with her the multi-faceted approach when dealing with a fatty liver: needs glycemic control, cholesterol management, diet/exercise, etc.    PLAN    Recheck HFP today Colonoscopy in 2026 Return in 6 months   Vicente Males  W. Cyndi Bender, PhD, ANP-BC [facility] Gastroenterology

## 2021-09-28 LAB — HEPATIC FUNCTION PANEL
ALT: 137 IU/L — ABNORMAL HIGH (ref 0–32)
AST: 106 IU/L — ABNORMAL HIGH (ref 0–40)
Albumin: 5 g/dL — ABNORMAL HIGH (ref 3.9–4.9)
Alkaline Phosphatase: 72 IU/L (ref 44–121)
Bilirubin Total: 0.7 mg/dL (ref 0.0–1.2)
Bilirubin, Direct: 0.17 mg/dL (ref 0.00–0.40)
Total Protein: 7.7 g/dL (ref 6.0–8.5)

## 2021-10-04 ENCOUNTER — Other Ambulatory Visit: Payer: Self-pay | Admitting: *Deleted

## 2021-10-04 DIAGNOSIS — R7989 Other specified abnormal findings of blood chemistry: Secondary | ICD-10-CM

## 2021-10-09 LAB — ANTI-SMOOTH MUSCLE ANTIBODY, IGG: Smooth Muscle Ab: 7 Units (ref 0–19)

## 2021-10-09 LAB — CERULOPLASMIN: Ceruloplasmin: 21.9 mg/dL (ref 19.0–39.0)

## 2021-10-09 LAB — MITOCHONDRIAL ANTIBODIES: Mitochondrial Ab: <20 Units (ref 0.0–20.0)

## 2021-10-09 LAB — ALPHA-1-ANTITRYPSIN: A-1 Antitrypsin: 124 mg/dL (ref 101–187)

## 2021-10-09 LAB — ANA: Anti Nuclear Antibody (ANA): NEGATIVE

## 2021-10-10 LAB — IMMUNOGLOBULINS A/E/G/M, SERUM
IgA/Immunoglobulin A, Serum: 164 mg/dL (ref 87–352)
IgE (Immunoglobulin E), Serum: 31 IU/mL (ref 6–495)
IgG (Immunoglobin G), Serum: 1026 mg/dL (ref 586–1602)
IgM (Immunoglobulin M), Srm: 198 mg/dL (ref 26–217)

## 2021-10-16 ENCOUNTER — Other Ambulatory Visit: Payer: Self-pay | Admitting: *Deleted

## 2021-10-16 ENCOUNTER — Other Ambulatory Visit: Payer: Self-pay

## 2021-10-16 DIAGNOSIS — R7989 Other specified abnormal findings of blood chemistry: Secondary | ICD-10-CM

## 2021-10-18 ENCOUNTER — Ambulatory Visit (HOSPITAL_COMMUNITY)
Admission: RE | Admit: 2021-10-18 | Discharge: 2021-10-18 | Disposition: A | Discharge status: Home / Self Care | Payer: Medicare Other | Source: Ambulatory Visit | Attending: Gastroenterology | Admitting: Gastroenterology

## 2021-10-18 DIAGNOSIS — R7989 Other specified abnormal findings of blood chemistry: Secondary | ICD-10-CM | POA: Insufficient documentation

## 2021-11-07 ENCOUNTER — Other Ambulatory Visit (HOSPITAL_COMMUNITY): Payer: Self-pay | Admitting: Adult Health

## 2021-11-07 DIAGNOSIS — Z780 Asymptomatic menopausal state: Secondary | ICD-10-CM

## 2021-11-16 ENCOUNTER — Ambulatory Visit (HOSPITAL_COMMUNITY)
Admission: RE | Admit: 2021-11-16 | Discharge: 2021-11-16 | Disposition: A | Discharge status: Home / Self Care | Payer: Medicare Other | Source: Ambulatory Visit | Attending: Adult Health | Admitting: Adult Health

## 2021-11-16 DIAGNOSIS — Z780 Asymptomatic menopausal state: Secondary | ICD-10-CM | POA: Insufficient documentation

## 2022-01-04 ENCOUNTER — Other Ambulatory Visit: Payer: Self-pay

## 2022-01-04 DIAGNOSIS — R7989 Other specified abnormal findings of blood chemistry: Secondary | ICD-10-CM

## 2022-01-07 ENCOUNTER — Other Ambulatory Visit (HOSPITAL_COMMUNITY): Payer: Self-pay | Admitting: Family Medicine

## 2022-01-07 DIAGNOSIS — Z1231 Encounter for screening mammogram for malignant neoplasm of breast: Secondary | ICD-10-CM

## 2022-01-16 LAB — HEPATIC FUNCTION PANEL
ALT: 102 IU/L — ABNORMAL HIGH (ref 0–32)
AST: 77 IU/L — ABNORMAL HIGH (ref 0–40)
Albumin: 4.7 g/dL (ref 3.9–4.9)
Alkaline Phosphatase: 78 IU/L (ref 44–121)
Bilirubin Total: 0.5 mg/dL (ref 0.0–1.2)
Bilirubin, Direct: 0.15 mg/dL (ref 0.00–0.40)
Total Protein: 7.2 g/dL (ref 6.0–8.5)

## 2022-03-08 ENCOUNTER — Ambulatory Visit (HOSPITAL_COMMUNITY)
Admission: RE | Admit: 2022-03-08 | Discharge: 2022-03-08 | Disposition: A | Discharge status: Home / Self Care | Payer: Medicare Other | Source: Ambulatory Visit | Attending: Family Medicine | Admitting: Family Medicine

## 2022-03-08 DIAGNOSIS — Z1231 Encounter for screening mammogram for malignant neoplasm of breast: Secondary | ICD-10-CM

## 2022-03-28 ENCOUNTER — Encounter: Payer: Self-pay | Admitting: Gastroenterology

## 2022-03-28 ENCOUNTER — Ambulatory Visit: Payer: Medicare Other | Admitting: Gastroenterology

## 2022-03-28 VITALS — BP 138/83 | HR 83 | Temp 97.2°F | Ht 63.0 in | Wt 204.6 lb

## 2022-03-28 DIAGNOSIS — K760 Fatty (change of) liver, not elsewhere classified: Secondary | ICD-10-CM

## 2022-03-28 NOTE — Patient Instructions (Signed)
We will send blood work to be done in April 2024!  We will see you in 6 months!  Continue the strict blood sugar control, diet changes, and add exercise into your regimen when you are able. I am proud of the changes you are making!  I enjoyed seeing you again today! At our first visit, I mentioned how I value our relationship and want to provide genuine, compassionate, and quality care. You may receive a survey regarding your visit with me, and I welcome your feedback! Thanks so much for taking the time to complete this. I look forward to seeing you again.   [name], PhD, ANP-BC [facility] Gastroenterology

## 2022-03-28 NOTE — Progress Notes (Signed)
Gastroenterology Office Note     Primary Care Physician:  Jani Gravel, MD  Primary Gastroenterologist: Dr. Abbey Chatters    Chief Complaint   Chief Complaint  Patient presents with   Follow-up    Elevated LFT's     History of Present Illness   Sherry Burgess is a 72 y.o. female presenting today in follow-up with a history of  elevated LFTS in setting of hepatic steatosis while on statin; this improved following discontinuation of statin but did not fully resolve.   Negative Hep C, no iron overload. Negative Hep B. Completed Hep A/B vaccinations. Due to fluctuating transaminases, we did further extensive labs which were all negative for autoimmune, PBC, Wilson's, alpha 1 antitrypsin.  AST and ALT in Nov 2023 were 77 and 102, respectively, which are improved from Aug 2023.  [place] with hepatic steatosis.   Has a Dexcom now as blood sugars had been fluctating. Drinking more water. No exercise. Making dietary changes. GERD controlled. No other concerns today.       Past Medical History:  Diagnosis Date   Asthma    Diabetes mellitus without complication (HCC)    GERD (gastroesophageal reflux disease)    Hypercholesterolemia    Hypertension     Past Surgical History:  Procedure Laterality Date   COLONOSCOPY N/A 05/16/2017   four simple adenomas, surveillanc 2022   COLONOSCOPY WITH PROPOFOL N/A 02/07/2020    prep of colon was fair. Non-bleeding internal hemorrhoids. Sigmoid and descending colon diverticulosis, one 2 mm polyp in transverse colon. Tubular adenoma. Surveillance 5 years.    DILATION AND CURETTAGE OF UTERUS     OVARY SURGERY     POLYPECTOMY  05/16/2017   Procedure: POLYPECTOMY;  Surgeon: Danie Binder, MD;  [facility];  Service: Endoscopy;;  cecal x2 ; hepatic flexure x2   POLYPECTOMY  02/07/2020   Procedure: POLYPECTOMY;  Surgeon: Eloise Harman, DO;  [facility];  Service: Endoscopy;;    Current Outpatient Medications  Medication  Sig Dispense Refill   Acetaminophen 325 MG CAPS Take 325 mg by mouth daily as needed.     aspirin EC 81 MG tablet Take 81 mg by mouth daily.     Cholecalciferol (VITAMIN D3) 50 MCG (2000 UT) TABS Take 2,000 Units by mouth daily.     fluticasone (FLONASE) 50 MCG/ACT nasal spray Place into both nostrils as needed for allergies or rhinitis.     glipiZIDE (GLUCOTROL XL) 10 MG 24 hr tablet Take 10 mg by mouth daily.     hydrochlorothiazide (HYDRODIURIL) 25 MG tablet Take 25 mg by mouth daily.     LEVEMIR FLEXPEN 100 UNIT/ML FlexPen Inject 30 Units into the skin daily.     losartan (COZAAR) 50 MG tablet Take 50 mg by mouth daily.     metFORMIN (GLUCOPHAGE) 1000 MG tablet Take 1,000 mg by mouth daily.     Multiple Vitamin (MULTIVITAMIN WITH MINERALS) TABS tablet Take 1 tablet by mouth daily.     omeprazole (PRILOSEC) 10 MG capsule Take 10 mg by mouth daily as needed.     gemfibrozil (LOPID) 600 MG tablet Take 600 mg by mouth 2 (two) times daily. (Patient not taking: Reported on 09/27/2021)     No current facility-administered medications for this visit.    Allergies as of 03/28/2022 - Review Complete 03/28/2022  Allergen Reaction Noted   Statins  01/18/2020    Family History  Problem Relation Age of Onset   Colon  cancer Paternal Uncle    Colon polyps Neg Hx     Social History   Socioeconomic History   Marital status: Married    Spouse name: Not on file   Number of children: Not on file   Years of education: Not on file   Highest education level: Not on file  Occupational History   Occupation: retired  Tobacco Use   Smoking status: Former   Smokeless tobacco: Never  Scientific laboratory technician Use: Never used  Substance and Sexual Activity   Alcohol use: No   Drug use: Never   Sexual activity: Not Currently  Other Topics Concern   Not on file  Social History Narrative   Not on file   Social Determinants of Health   Financial Resource Strain: Not on file  Food Insecurity: Not on  file  Transportation Needs: Not on file  Physical Activity: Not on file  Stress: Not on file  Social Connections: Not on file  Intimate Partner Violence: Not on file     Review of Systems   Gen: Denies any fever, chills, fatigue, weight loss, lack of appetite.  CV: Denies chest pain, heart palpitations, peripheral edema, syncope.  Resp: Denies shortness of breath at rest or with exertion. Denies wheezing or cough.  GI: Denies dysphagia or odynophagia. Denies jaundice, hematemesis, fecal incontinence. GU : Denies urinary burning, urinary frequency, urinary hesitancy MS: Denies joint pain, muscle weakness, cramps, or limitation of movement.  Derm: Denies rash, itching, dry skin Psych: Denies depression, anxiety, memory loss, and confusion Heme: Denies bruising, bleeding, and enlarged lymph nodes.   Physical Exam   BP 138/83   Pulse 83   Temp (!) 97.2 F (36.2 C)   Ht '5\' 3"'$  (1.6 m)   Wt 204 lb 9.6 oz (92.8 kg)   BMI 36.24 kg/m  General:   Alert and oriented. Pleasant and cooperative. Well-nourished and well-developed.  Head:  Normocephalic and atraumatic. Eyes:  Without icterus Abdomen:  +BS, soft, non-tender and non-distended. No HSM noted. No guarding or rebound. No masses appreciated.  Rectal:  Deferred  Msk:  Symmetrical without gross deformities. Normal posture. Extremities:  Without edema. Neurologic:  Alert and  oriented x4;  grossly normal neurologically. Skin:  Intact without significant lesions or rashes. Psych:  Alert and cooperative. Normal mood and affect.   Assessment   Sherry Burgess is a 72 y.o. female presenting today in follow-up with a history of   elevated LFTS in setting of hepatic steatosis while on statin; this improved following discontinuation of statin but did not fully resolve.   She has had thorough serologies, and Nov 2023 transaminases improving from Aug 2023. Recent US with hepatic steatosis. She has been making dietary changes and has  stricter glucose control. I suspect we will continue to see transaminases improve.     PLAN    Repeat HFP in April 2024 Diet/behavior modifications Comorbidity management Consider biopsy if fluctuating/worsening transaminases  6 month return   [name], PhD, [facility] Gastroenterology

## 2022-09-03 ENCOUNTER — Encounter: Payer: Self-pay | Admitting: Gastroenterology

## 2022-12-12 ENCOUNTER — Ambulatory Visit: Payer: Medicare Other | Admitting: Gastroenterology

## 2022-12-12 ENCOUNTER — Encounter: Payer: Self-pay | Admitting: Gastroenterology

## 2022-12-12 VITALS — BP 138/81 | HR 82 | Temp 97.7°F | Ht 64.0 in | Wt 195.8 lb

## 2022-12-12 DIAGNOSIS — K760 Fatty (change of) liver, not elsewhere classified: Secondary | ICD-10-CM

## 2022-12-12 DIAGNOSIS — R748 Abnormal levels of other serum enzymes: Secondary | ICD-10-CM | POA: Diagnosis not present

## 2022-12-12 DIAGNOSIS — R7989 Other specified abnormal findings of blood chemistry: Secondary | ICD-10-CM

## 2022-12-12 NOTE — Patient Instructions (Addendum)
Please complete the blood work. We have also ordered a special ultrasound. We may need to discuss pursuing a liver biopsy.   I am providing the mediterranean diet, this is a guideline to help you know which foods you should eat and those you should try to limit or avoid. It is important to make sure you are getting atleast 30 minutes of exercise 4-5x/week You should aim for 5-7% decrease in overall weight. Fatty liver is usually well managed with dietary and lifestyle modifications, however, in rare cases, this can progress to fibrosis/cirrhosis of the liver, which is serious.  Please avoid alcohol as this can worsen liver issues.   Further recommendations to follow!  I enjoyed seeing you again today! I value our relationship and want to provide genuine, compassionate, and quality care. You may receive a survey regarding your visit with me, and I welcome your feedback! Thanks so much for taking the time to complete this. I look forward to seeing you again.      Gelene Mink, PhD, ANP-BC [facility]

## 2022-12-12 NOTE — Progress Notes (Signed)
Gastroenterology Office Note     Primary Care Physician:  Pearson Grippe, MD  Primary Gastroenterologist: Dr. Marletta Lor   Chief Complaint   Chief Complaint  Patient presents with   Follow-up    Follow up. No problems      History of Present Illness   Sherry Burgess is a 72 y.o. female presenting today with a history of  elevated LFTS in setting of hepatic steatosis while on statin; this improved following discontinuation of statin but did not fully resolve.   Negative Hep C, no iron overload. Negative Hep B. Completed Hep A/B vaccinations. Due to fluctuating transaminases, we did further extensive labs which were all negative for autoimmune, PBC, Wilson's, alpha 1 antitrypsin. US abdomen Aug 2023 with hepatic steatosis.   Last labs on file from Nov 2023 with AST 77, ALT 102. Recommending biopsy if continues to remain elevated. May 2024 Alk Phos 55, AST 96, ALT 126, Sept 2024 AST 136, ALT 150. Sept 2024 Tchol 274, TG 453, HDL 29, LDL 158. A1c 7.   No abdominal pain, N/V, changes in bowel habits, constipation, diarrhea, overt GI bleeding, GERD, dysphagia, unexplained weight loss, lack of appetite, unexplained weight gain.      Colonoscopy Dec 2021: hemorrhoids, diverticulosis, tubular adenomas, surveillance in 2026.    Past Medical History:  Diagnosis Date   Asthma    Diabetes mellitus without complication (HCC)    GERD (gastroesophageal reflux disease)    Hypercholesterolemia    Hypertension     Past Surgical History:  Procedure Laterality Date   COLONOSCOPY N/A 05/16/2017   four simple adenomas, surveillanc 2022   COLONOSCOPY WITH PROPOFOL N/A 02/07/2020    prep of colon was fair. Non-bleeding internal hemorrhoids. Sigmoid and descending colon diverticulosis, one 2 mm polyp in transverse colon. Tubular adenoma. Surveillance 5 years.    DILATION AND CURETTAGE OF UTERUS     OVARY SURGERY     POLYPECTOMY  05/16/2017   Procedure: POLYPECTOMY;  Surgeon: West Bali,  MD;  [facility];  Service: Endoscopy;;  cecal x2 ; hepatic flexure x2   POLYPECTOMY  02/07/2020   Procedure: POLYPECTOMY;  Surgeon: Lanelle Bal, DO;  [facility];  Service: Endoscopy;;    Current Outpatient Medications  Medication Sig Dispense Refill   Acetaminophen 325 MG CAPS Take 325 mg by mouth daily as needed.     aspirin EC 81 MG tablet Take 81 mg by mouth daily.     Cholecalciferol (VITAMIN D3) 50 MCG (2000 UT) TABS Take 2,000 Units by mouth daily.     fluticasone (FLONASE) 50 MCG/ACT nasal spray Place into both nostrils as needed for allergies or rhinitis.     glipiZIDE (GLUCOTROL XL) 10 MG 24 hr tablet Take 10 mg by mouth daily.     hydrochlorothiazide (HYDRODIURIL) 25 MG tablet Take 25 mg by mouth daily.     LEVEMIR FLEXPEN 100 UNIT/ML FlexPen Inject 30 Units into the skin daily.     losartan (COZAAR) 50 MG tablet Take 50 mg by mouth daily.     metFORMIN (GLUCOPHAGE) 1000 MG tablet Take 1,000 mg by mouth daily.     Multiple Vitamin (MULTIVITAMIN WITH MINERALS) TABS tablet Take 1 tablet by mouth daily.     omeprazole (PRILOSEC) 10 MG capsule Take 10 mg by mouth daily as needed.     No current facility-administered medications for this visit.    Allergies as of 12/12/2022 - Review Complete 12/12/2022  Allergen Reaction  Noted   Statins  01/18/2020    Family History  Problem Relation Age of Onset   Colon cancer Paternal Uncle    Colon polyps Neg Hx     Social History   Socioeconomic History   Marital status: Married    Spouse name: Not on file   Number of children: Not on file   Years of education: Not on file   Highest education level: Not on file  Occupational History   Occupation: retired  Tobacco Use   Smoking status: Former   Smokeless tobacco: Never  Advertising account planner   Vaping status: Never Used  Substance and Sexual Activity   Alcohol use: No   Drug use: Never   Sexual activity: Not Currently  Other Topics Concern   Not on file   Social History Narrative   Not on file   Social Determinants of Health   Financial Resource Strain: Not on file  Food Insecurity: Not on file  Transportation Needs: Not on file  Physical Activity: Not on file  Stress: Not on file  Social Connections: Not on file  Intimate Partner Violence: Not on file     Review of Systems   Gen: Denies any fever, chills, fatigue, weight loss, lack of appetite.  CV: Denies chest pain, heart palpitations, peripheral edema, syncope.  Resp: Denies shortness of breath at rest or with exertion. Denies wheezing or cough.  GI: Denies dysphagia or odynophagia. Denies jaundice, hematemesis, fecal incontinence. GU : Denies urinary burning, urinary frequency, urinary hesitancy MS: Denies joint pain, muscle weakness, cramps, or limitation of movement.  Derm: Denies rash, itching, dry skin Psych: Denies depression, anxiety, memory loss, and confusion Heme: Denies bruising, bleeding, and enlarged lymph nodes.   Physical Exam   BP 138/81 (BP Location: Right Arm, Patient Position: Sitting, Cuff Size: Large)   Pulse 82   Temp 97.7 F (36.5 C) (Temporal)   Ht 5\' 4"  (1.626 m)   Wt 195 lb 12.8 oz (88.8 kg)   BMI 33.61 kg/m  General:   Alert and oriented. Pleasant and cooperative. Well-nourished and well-developed.  Head:  Normocephalic and atraumatic. Eyes:  Without icterus Abdomen:  +BS, soft, non-tender and non-distended. No HSM noted. No guarding or rebound. No masses appreciated.  Rectal:  Deferred  Msk:  Symmetrical without gross deformities. Normal posture. Extremities:  Without edema. Neurologic:  Alert and  oriented x4;  grossly normal neurologically. Skin:  Intact without significant lesions or rashes. Psych:  Alert and cooperative. Normal mood and affect.   Assessment   Sherry Burgess is a 72 y.o. female presenting today with a history of elevated LFTS in setting of hepatic steatosis while on statin; this improved following  discontinuation of statin but did not fully resolve.   As noted above, thorough serologies completed. Known hepatic steatosis. Persistently elevated transaminases noted. Dyslipidemia also noted.   Check fibrosis with serologies and elastography. May need to discuss liver ibopsy in the future. Follow Mediterranean diet.     Colonoscopy Dec 2021: hemorrhoids, diverticulosis, tubular adenomas, surveillance in 2026.    PLAN    US abdomen with elastography Celiac labs, ELF, fibrosure  Low threshold for liver biopsy due to persistently elevated transaminases Further recommendations to follow.    Gelene Mink, PhD, ANP-BC [facility]

## 2022-12-14 LAB — CELIAC DISEASE PANEL
Endomysial IgA: NEGATIVE
IgA/Immunoglobulin A, Serum: 128 mg/dL (ref 64–422)
Transglutaminase IgA: <2 U/mL (ref 0–3)

## 2022-12-14 LAB — NASH FIBROSURE(R) PLUS
ALPHA 2-MACROGLOBULINS, QN: 313 mg/dL — ABNORMAL HIGH (ref 110–276)
ALT (SGPT) P5P: 140 IU/L — ABNORMAL HIGH (ref 0–40)
AST (SGOT) P5P: 125 IU/L — ABNORMAL HIGH (ref 0–40)
Apolipoprotein A-1: 131 mg/dL (ref 116–209)
Bilirubin, Total: 0.7 mg/dL (ref 0.0–1.2)
Cholesterol, Total: 294 mg/dL — ABNORMAL HIGH (ref 100–199)
Fibrosis Score: 0.54 — ABNORMAL HIGH (ref 0.00–0.21)
GGT: 20 IU/L (ref 0–60)
Glucose: 79 mg/dL (ref 70–99)
Haptoglobin: 128 mg/dL (ref 42–346)
NASH Score: 0.94 — ABNORMAL HIGH (ref 0.00–0.25)
Steatosis Score: 0.69 — ABNORMAL HIGH (ref 0.00–0.40)
Triglycerides: 323 mg/dL — ABNORMAL HIGH (ref 0–149)

## 2022-12-14 LAB — ENHANCED LIVER FIBROSIS (ELF): ELF(TM) Score: 9.37 (ref <9.80)

## 2022-12-18 ENCOUNTER — Ambulatory Visit (HOSPITAL_COMMUNITY)
Admission: RE | Admit: 2022-12-18 | Discharge: 2022-12-18 | Disposition: A | Discharge status: Home / Self Care | Payer: Medicare Other | Source: Ambulatory Visit | Attending: Gastroenterology | Admitting: Gastroenterology

## 2022-12-18 DIAGNOSIS — R7989 Other specified abnormal findings of blood chemistry: Secondary | ICD-10-CM | POA: Insufficient documentation

## 2022-12-18 DIAGNOSIS — K760 Fatty (change of) liver, not elsewhere classified: Secondary | ICD-10-CM | POA: Insufficient documentation

## 2023-01-20 ENCOUNTER — Other Ambulatory Visit: Payer: Self-pay | Admitting: *Deleted

## 2023-01-20 DIAGNOSIS — R7989 Other specified abnormal findings of blood chemistry: Secondary | ICD-10-CM

## 2023-02-04 ENCOUNTER — Encounter (HOSPITAL_COMMUNITY): Payer: Self-pay | Admitting: Radiology

## 2023-02-06 ENCOUNTER — Other Ambulatory Visit: Payer: Self-pay | Admitting: Radiology

## 2023-02-06 ENCOUNTER — Encounter (HOSPITAL_COMMUNITY): Payer: Self-pay

## 2023-02-06 DIAGNOSIS — K760 Fatty (change of) liver, not elsewhere classified: Secondary | ICD-10-CM

## 2023-02-06 DIAGNOSIS — R7989 Other specified abnormal findings of blood chemistry: Secondary | ICD-10-CM

## 2023-02-07 ENCOUNTER — Ambulatory Visit (HOSPITAL_COMMUNITY)
Admission: RE | Admit: 2023-02-07 | Discharge: 2023-02-07 | Disposition: A | Discharge status: Home / Self Care | Payer: Medicare Other | Source: Ambulatory Visit | Attending: Gastroenterology | Admitting: Gastroenterology

## 2023-02-07 DIAGNOSIS — R7989 Other specified abnormal findings of blood chemistry: Secondary | ICD-10-CM

## 2023-02-07 NOTE — Progress Notes (Signed)
Patient took aspirin yesterday, 02/06/23, will have to reschedule liver biopsy per [facility] in radiology.

## 2023-03-03 ENCOUNTER — Other Ambulatory Visit: Payer: Self-pay | Admitting: Radiology

## 2023-03-04 ENCOUNTER — Encounter (HOSPITAL_COMMUNITY): Payer: Self-pay

## 2023-03-04 ENCOUNTER — Ambulatory Visit (HOSPITAL_COMMUNITY)
Admission: RE | Admit: 2023-03-04 | Discharge: 2023-03-04 | Disposition: A | Discharge status: Home / Self Care | Payer: Medicare Other | Source: Ambulatory Visit | Attending: Gastroenterology | Admitting: Gastroenterology

## 2023-03-04 ENCOUNTER — Other Ambulatory Visit: Payer: Self-pay

## 2023-03-04 DIAGNOSIS — R945 Abnormal results of liver function studies: Secondary | ICD-10-CM | POA: Insufficient documentation

## 2023-03-04 DIAGNOSIS — R7989 Other specified abnormal findings of blood chemistry: Secondary | ICD-10-CM

## 2023-03-04 DIAGNOSIS — K760 Fatty (change of) liver, not elsewhere classified: Secondary | ICD-10-CM | POA: Insufficient documentation

## 2023-03-04 LAB — CBC
HCT: 41.8 % (ref 36.0–46.0)
Hemoglobin: 13.7 g/dL (ref 12.0–15.0)
MCH: 29.3 pg (ref 26.0–34.0)
MCHC: 32.8 g/dL (ref 30.0–36.0)
MCV: 89.3 fL (ref 80.0–100.0)
Platelets: 197 10*3/uL (ref 150–400)
RBC: 4.68 MIL/uL (ref 3.87–5.11)
RDW: 13.2 % (ref 11.5–15.5)
WBC: 6.2 10*3/uL (ref 4.0–10.5)
nRBC: 0 % (ref 0.0–0.2)

## 2023-03-04 LAB — GLUCOSE, CAPILLARY
Glucose-Capillary: 119 mg/dL — ABNORMAL HIGH (ref 70–99)
Glucose-Capillary: 176 mg/dL — ABNORMAL HIGH (ref 70–99)

## 2023-03-04 LAB — PROTIME-INR
INR: 1 (ref 0.8–1.2)
Prothrombin Time: 12.9 s (ref 11.4–15.2)

## 2023-03-04 MED ORDER — LIDOCAINE HCL (PF) 1 % IJ SOLN
10.0000 mL | Freq: Once | INTRAMUSCULAR | Status: AC
  Administered 2023-03-04: 10 mL via INTRADERMAL

## 2023-03-04 MED ORDER — FENTANYL CITRATE (PF) 100 MCG/2ML IJ SOLN
INTRAMUSCULAR | Status: AC
  Filled 2023-03-04: qty 2

## 2023-03-04 MED ORDER — MIDAZOLAM HCL 2 MG/2ML IJ SOLN
INTRAMUSCULAR | Status: AC | PRN
  Administered 2023-03-04: .5 mg via INTRAVENOUS
  Administered 2023-03-04: 1 mg via INTRAVENOUS

## 2023-03-04 MED ORDER — FENTANYL CITRATE (PF) 100 MCG/2ML IJ SOLN
INTRAMUSCULAR | Status: AC | PRN
  Administered 2023-03-04: 25 ug via INTRAVENOUS
  Administered 2023-03-04: 50 ug via INTRAVENOUS

## 2023-03-04 MED ORDER — MIDAZOLAM HCL 2 MG/2ML IJ SOLN
INTRAMUSCULAR | Status: AC
  Filled 2023-03-04: qty 2

## 2023-03-04 MED ORDER — OXYCODONE HCL 5 MG PO TABS: 5.0000 mg | ORAL_TABLET | ORAL | Status: DC | PRN

## 2023-03-04 NOTE — Procedures (Signed)
 Vascular and Interventional Radiology Procedure Note  Patient: TACORI KVAMME DOB: [birth year] Medical Record Number: [id number] Note Date/Time: 03/04/23 8:40 AM   Performing Physician: Thom Hall, MD Assistant(s): None  Diagnosis: elevated LFTs   Procedure: LIVER BIOPSY, NON-TARGETED  Anesthesia: Conscious Sedation Complications: None Estimated Blood Loss: Minimal Specimens: Sent for Pathology  Findings:  Successful Ultrasound-guided biopsy of liver. A total of 3 samples were obtained. Hemostasis of the tract was achieved using Manual Pressure.  Plan: Bed rest for 2 hours.  See detailed procedure note with images in PACS. The patient tolerated the procedure well without incident or complication and was returned to Recovery in stable condition.    Thom Hall, MD [facility] Radiology   Pager. [phone] Clinic. [phone]

## 2023-03-04 NOTE — H&P (Signed)
 Chief Complaint: Patient was seen in consultation today for elevated liver function-- liver core biopsy at the request of Shirlean Therisa ORN  Referring Physician(s): Shirlean Therisa ORN  Supervising Physician: Hughes Simmonds  Patient Status: [facility] - Out-pt  History of Present Illness: Sherry Burgess is a 73 y.o. female   FULL Code status per pt Follows with GI regarding elevated liver functions Noted 1 yr ago Hepatic steatosis- while on statin Remain high-- no resolution after discontinuation of statin  Scheduled now for biopsy at GI request   Past Medical History:  Diagnosis Date   Asthma    Diabetes mellitus without complication (HCC)    GERD (gastroesophageal reflux disease)    Hypercholesterolemia    Hypertension     Past Surgical History:  Procedure Laterality Date   COLONOSCOPY N/A 05/16/2017   four simple adenomas, surveillanc 2022   COLONOSCOPY WITH PROPOFOL  N/A 02/07/2020    prep of colon was fair. Non-bleeding internal hemorrhoids. Sigmoid and descending colon diverticulosis, one 2 mm polyp in transverse colon. Tubular adenoma. Surveillance 5 years.    DILATION AND CURETTAGE OF UTERUS     OVARY SURGERY     POLYPECTOMY  05/16/2017   Procedure: POLYPECTOMY;  Surgeon: Harvey Margo CROME, MD;  [facility];  Service: Endoscopy;;  cecal x2 ; hepatic flexure x2   POLYPECTOMY  02/07/2020   Procedure: POLYPECTOMY;  Surgeon: Cindie Carlin POUR, DO;  [facility];  Service: Endoscopy;;    Allergies: Statins  Medications: Prior to Admission medications   Medication Sig Start Date End Date Taking? Authorizing Provider  gemfibrozil (LOPID) 600 MG tablet Take 600 mg by mouth 2 (two) times daily. 11/20/22  Yes [provider]  glipiZIDE (GLUCOTROL XL) 10 MG 24 hr tablet Take 10 mg by mouth daily. 11/10/19  Yes [provider]  hydrochlorothiazide (HYDRODIURIL) 25 MG tablet Take 25 mg by mouth daily.   Yes [provider]  LEVEMIR   FLEXPEN 100 UNIT/ML FlexPen Inject 30 Units into the skin daily. 09/07/21  Yes [provider]  losartan (COZAAR) 50 MG tablet Take 50 mg by mouth daily.   Yes [provider]  metFORMIN (GLUCOPHAGE) 1000 MG tablet Take 1,000 mg by mouth daily.   Yes [provider]  Multiple Vitamin (MULTIVITAMIN WITH MINERALS) TABS tablet Take 1 tablet by mouth daily.   Yes [provider]  omeprazole (PRILOSEC) 10 MG capsule Take 10 mg by mouth daily as needed. 08/08/19  Yes [provider]  Acetaminophen  325 MG CAPS Take 325 mg by mouth daily as needed.    [provider]  aspirin  EC 81 MG tablet Take 81 mg by mouth daily.    [provider]  Cholecalciferol (VITAMIN D3) 50 MCG (2000 UT) TABS Take 2,000 Units by mouth daily.    [provider]  fluticasone (FLONASE) 50 MCG/ACT nasal spray Place into both nostrils as needed for allergies or rhinitis.    [provider]     Family History  Problem Relation Age of Onset   Colon cancer Paternal Uncle    Colon polyps Neg Hx     Social History   Socioeconomic History   Marital status: Married    Spouse name: Not on file   Number of children: Not on file   Years of education: Not on file   Highest education level: Not on file  Occupational History   Occupation: retired  Tobacco Use   Smoking status: Former   Smokeless  tobacco: Never  Vaping Use   Vaping status: Never Used  Substance and Sexual Activity   Alcohol use: No   Drug use: Never   Sexual activity: Not Currently  Other Topics Concern   Not on file  Social History Narrative   Not on file   Social Drivers of Health   Financial Resource Strain: Not on file  Food Insecurity: Not on file  Transportation Needs: Not on file  Physical Activity: Not on file  Stress: Not on file  Social Connections: Not on file    Review of Systems: A 12 point ROS discussed and pertinent positives are indicated in the HPI  above.  All other systems are negative.  Review of Systems  Constitutional:  Negative for activity change, fatigue, fever and unexpected weight change.  Respiratory:  Negative for cough and shortness of breath.   Cardiovascular:  Negative for chest pain.  Gastrointestinal:  Negative for abdominal pain and nausea.  Psychiatric/Behavioral:  Negative for behavioral problems and confusion.     Vital Signs: BP (!) 150/91 (BP Location: Right Arm)   Pulse 81   Temp 97.8 F (36.6 C) (Oral)   Resp 16   Ht 5' 4 (1.626 m)   Wt 210 lb (95.3 kg)   SpO2 100%   BMI 36.05 kg/m   Advance Care Plan: The advanced care plan/surrogate decision maker was discussed at the time of visit and documented in the medical record.    Physical Exam Vitals reviewed.  HENT:     Mouth/Throat:     Mouth: Mucous membranes are moist.  Cardiovascular:     Rate and Rhythm: Normal rate and regular rhythm.     Heart sounds: Normal heart sounds.  Pulmonary:     Effort: Pulmonary effort is normal.     Breath sounds: Normal breath sounds. No wheezing.  Abdominal:     Palpations: Abdomen is soft.     Tenderness: There is no abdominal tenderness.  Musculoskeletal:        General: Normal range of motion.  Skin:    General: Skin is warm.  Neurological:     Mental Status: She is alert and oriented to person, place, and time.  Psychiatric:        Behavior: Behavior normal.     Imaging: No results found.  Labs:  CBC: Recent Labs    03/04/23 0631  WBC 6.2  HGB 13.7  HCT 41.8  PLT 197    COAGS: Recent Labs    03/04/23 0631  INR 1.0    BMP: No results for input(s): NA, K, CL, CO2, GLUCOSE, BUN, CALCIUM, CREATININE, GFRNONAA, GFRAA in the last 8760 hours.  Invalid input(s): CMP  LIVER FUNCTION TESTS: No results for input(s): BILITOT, AST, ALT, ALKPHOS, PROT, ALBUMIN in the last 8760 hours.  TUMOR MARKERS: No results for input(s): AFPTM, CEA, CA199,  CHROMGRNA in the last 8760 hours.  Assessment and Plan:  Scheduled for liver core biopsy secondary elevated liver functions Risks and benefits of liver core biopsy was discussed with the patient and/or patient's family including, but not limited to bleeding, infection, damage to adjacent structures or low yield requiring additional tests.  All of the questions were answered and there is agreement to proceed.  Consent signed and in chart.  Thank you for this interesting consult.  I greatly enjoyed meeting OMARI KOSLOSKY and look forward to participating in their care.  A copy of this report was sent to the requesting provider on this  date.  Electronically Signed: Sharlet DELENA Candle, PA-C 03/04/2023, 7:02 AM   I spent a total of  30 Minutes   in face to face in clinical consultation, greater than 50% of which was counseling/coordinating care for liver core biopsy

## 2023-03-06 LAB — SURGICAL PATHOLOGY

## 2023-05-29 ENCOUNTER — Encounter: Payer: Self-pay | Admitting: Gastroenterology

## 2023-05-29 ENCOUNTER — Ambulatory Visit: Payer: Medicare Other | Admitting: Gastroenterology

## 2023-05-29 VITALS — BP 144/82 | HR 78 | Temp 97.6°F | Ht 64.0 in | Wt 199.2 lb

## 2023-05-29 DIAGNOSIS — K760 Fatty (change of) liver, not elsewhere classified: Secondary | ICD-10-CM

## 2023-05-29 NOTE — Patient Instructions (Addendum)
 Please have blood work done at [facility].  Continue to work on blood sugar and cholesterol control. I have attached a Mediterranean diet, which can be helpful. I am excited that you will be working on physical activity!  We will see you in 6 months!  I enjoyed seeing you again today! I value our relationship and want to provide genuine, compassionate, and quality care. You may receive a survey regarding your visit with me, and I welcome your feedback! Thanks so much for taking the time to complete this. I look forward to seeing you again.      Gelene Mink, PhD, ANP-BC [facility]

## 2023-05-29 NOTE — Progress Notes (Signed)
 Gastroenterology Office Note     Primary Care Physician:  Pearson Grippe, MD  Primary Gastroenterologist:   Chief Complaint   Chief Complaint  Patient presents with   Follow-up    Doing well, no issues     History of Present Illness   Sherry Burgess is a 73 y.o. female presenting today with a history of elevated LFTS in setting of hepatic steatosis while on statin; this improved following discontinuation of statin but did not fully resolve. Therefore, we pursued further advanced serologies.   Negative Hep C, no iron overload. Negative Hep B. Completed Hep A/B vaccinations. Due to fluctuating transaminases, we did further extensive labs which were all negative for autoimmune, PBC, Wilson's, alpha 1 antitrypsin. Elastography Oct 2024 showed median kPa of 2.9; however, results not ideal due to elevated ratio of 0.62. CBD has been normal at 3mm. No elevation of alk phos or Tbili.   Liver biopsy completed in interim from last visit: hepatic steatosis and no fibrosis. 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 ELF low risk at 9.37.   She is unsure her A1c level. Sugar has been labile. Will be working on [employer] training with one of her family members. She had a significant loss in her family: close family friend who passed after liver transplant rejection (age 85). She has been grieving and notes this has been difficult.   No abdominal pain, N/V, changes in bowel habits, constipation, diarrhea, overt GI bleeding, GERD, dysphagia, unexplained weight loss, lack of appetite, unexplained weight gain.    Colonoscopy Dec 2021: hemorrhoids, diverticulosis, tubular adenomas, surveillance in 2026.    Past Medical History:  Diagnosis Date   Asthma    Diabetes mellitus without complication (HCC)    GERD (gastroesophageal reflux disease)    Hypercholesterolemia    Hypertension     Past Surgical History:  Procedure Laterality Date   COLONOSCOPY N/A 05/16/2017   four  simple adenomas, surveillanc 2022   COLONOSCOPY WITH PROPOFOL N/A 02/07/2020    prep of colon was fair. Non-bleeding internal hemorrhoids. Sigmoid and descending colon diverticulosis, one 2 mm polyp in transverse colon. Tubular adenoma. Surveillance 5 years.    DILATION AND CURETTAGE OF UTERUS     OVARY SURGERY     POLYPECTOMY  05/16/2017   Procedure: POLYPECTOMY;  Surgeon: West Bali, MD;  [facility];  Service: Endoscopy;;  cecal x2 ; hepatic flexure x2   POLYPECTOMY  02/07/2020   Procedure: POLYPECTOMY;  Surgeon: Lanelle Bal, DO;  [facility];  Service: Endoscopy;;    Current Outpatient Medications  Medication Sig Dispense Refill   Acetaminophen 325 MG CAPS Take 325 mg by mouth daily as needed.     aspirin EC 81 MG tablet Take 81 mg by mouth daily.     Cholecalciferol (VITAMIN D3) 50 MCG (2000 UT) TABS Take 2,000 Units by mouth daily.     Continuous Glucose Sensor (FREESTYLE LIBRE 2 SENSOR) MISC      fluticasone (FLONASE) 50 MCG/ACT nasal spray Place into both nostrils as needed for allergies or rhinitis.     gemfibrozil (LOPID) 600 MG tablet Take 600 mg by mouth 2 (two) times daily.     glipiZIDE (GLUCOTROL XL) 10 MG 24 hr tablet Take 10 mg by mouth daily.     hydrochlorothiazide (HYDRODIURIL) 25 MG tablet Take 25 mg by mouth daily.     LEVEMIR FLEXPEN 100 UNIT/ML FlexPen Inject 30 Units into the skin  daily.     losartan (COZAAR) 100 MG tablet Take 100 mg by mouth daily.     metFORMIN (GLUCOPHAGE) 1000 MG tablet Take 1,000 mg by mouth daily.     Multiple Vitamin (MULTIVITAMIN WITH MINERALS) TABS tablet Take 1 tablet by mouth daily.     omeprazole (PRILOSEC) 10 MG capsule Take 10 mg by mouth daily as needed.     No current facility-administered medications for this visit.    Allergies as of 05/29/2023 - Review Complete 05/29/2023  Allergen Reaction Noted   Statins  01/18/2020    Family History  Problem Relation Age of Onset   Colon cancer  Paternal Uncle    Colon polyps Neg Hx     Social History   Socioeconomic History   Marital status: Married    Spouse name: Not on file   Number of children: Not on file   Years of education: Not on file   Highest education level: Not on file  Occupational History   Occupation: retired  Tobacco Use   Smoking status: Former   Smokeless tobacco: Never  Advertising account planner   Vaping status: Never Used  Substance and Sexual Activity   Alcohol use: No   Drug use: Never   Sexual activity: Not Currently  Other Topics Concern   Not on file  Social History Narrative   Not on file   Social Drivers of Health   Financial Resource Strain: Not on file  Food Insecurity: Not on file  Transportation Needs: Not on file  Physical Activity: Not on file  Stress: Not on file  Social Connections: Not on file  Intimate Partner Violence: Not on file     Review of Systems   Gen: Denies any fever, chills, fatigue, weight loss, lack of appetite.  CV: Denies chest pain, heart palpitations, peripheral edema, syncope.  Resp: Denies shortness of breath at rest or with exertion. Denies wheezing or cough.  GI: Denies dysphagia or odynophagia. Denies jaundice, hematemesis, fecal incontinence. GU : Denies urinary burning, urinary frequency, urinary hesitancy MS: Denies joint pain, muscle weakness, cramps, or limitation of movement.  Derm: Denies rash, itching, dry skin Psych: Denies depression, anxiety, memory loss, and confusion Heme: Denies bruising, bleeding, and enlarged lymph nodes.   Physical Exam   BP (!) 144/82 (BP Location: Right Arm, Patient Position: Sitting, Cuff Size: Large)   Pulse 78   Temp 97.6 F (36.4 C) (Oral)   Ht 5\' 4"  (1.626 m)   Wt 199 lb 3.2 oz (90.4 kg)   SpO2 98%   BMI 34.19 kg/m  General:   Alert and oriented. Pleasant and cooperative. Well-nourished and well-developed.  Head:  Normocephalic and atraumatic. Eyes:  Without icterus Abdomen:  +BS, soft, non-tender and  non-distended. No HSM noted. No guarding or rebound. No masses appreciated.  Rectal:  Deferred  Msk:  Symmetrical without gross deformities. Normal posture. Extremities:  Without edema. Neurologic:  Alert and  oriented x4;  grossly normal neurologically. Skin:  Intact without significant lesions or rashes. Psych:  Alert and cooperative. Normal mood and affect.   Assessment   Sherry Burgess is a 73 y.o. female presenting today with a history of chronically elevated fluctuating transaminases in setting of hepatic steatosis, s/p extensive evaluation as noted above. . 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 No fibrosis or other concerning findings.  We discussed fatty liver disease and management. Currently, will hold on Rezdiffra. Although fibrosure test said  grade 2 fibrosis, liver biopsy was not consistent with this. ELF score low risk. We will need to continue to follow her closely, with ELF score yearly. Continue with strict glycemic and lipid management, weight loss, diet, exercise. MRI has not been done yet as no elevated alk phos or bili and will consider this after review of LFTs.      PLAN    CBC, CMP today Diet/behavior modifications Further recommendations following labs 6 month follow-up regardless Colonoscopy 2026   Gelene Mink, PhD, [facility] Gastroenterology

## 2023-06-20 LAB — CBC WITH DIFFERENTIAL/PLATELET
Basophils Absolute: 0 10*3/uL (ref 0.0–0.2)
Basos: 1 %
EOS (ABSOLUTE): 0.1 10*3/uL (ref 0.0–0.4)
Eos: 2 %
Hematocrit: 41.2 % (ref 34.0–46.6)
Hemoglobin: 14 g/dL (ref 11.1–15.9)
Immature Grans (Abs): 0 10*3/uL (ref 0.0–0.1)
Immature Granulocytes: 0 %
Lymphocytes Absolute: 2.2 10*3/uL (ref 0.7–3.1)
Lymphs: 47 %
MCH: 30.4 pg (ref 26.6–33.0)
MCHC: 34 g/dL (ref 31.5–35.7)
MCV: 90 fL (ref 79–97)
Monocytes Absolute: 0.3 10*3/uL (ref 0.1–0.9)
Monocytes: 7 %
Neutrophils Absolute: 2 10*3/uL (ref 1.4–7.0)
Neutrophils: 43 %
Platelets: 180 10*3/uL (ref 150–450)
RBC: 4.6 x10E6/uL (ref 3.77–5.28)
RDW: 12.5 % (ref 11.7–15.4)
WBC: 4.6 10*3/uL (ref 3.4–10.8)

## 2023-06-20 LAB — COMPREHENSIVE METABOLIC PANEL WITH GFR
Albumin: 4.6 g/dL (ref 3.8–4.8)
Alkaline Phosphatase: 83 IU/L (ref 44–121)
BUN/Creatinine Ratio: 19 (ref 12–28)
BUN: 13 mg/dL (ref 8–27)
Bilirubin Total: 0.3 mg/dL (ref 0.0–1.2)
CO2: 21 mmol/L (ref 20–29)
Calcium: 9.8 mg/dL (ref 8.7–10.3)
Chloride: 96 mmol/L (ref 96–106)
Creatinine, Ser: 0.7 mg/dL (ref 0.57–1.00)
Globulin, Total: 2.5 g/dL (ref 1.5–4.5)
Glucose: 330 mg/dL — ABNORMAL HIGH (ref 70–99)
Potassium: 4.3 mmol/L (ref 3.5–5.2)
Sodium: 137 mmol/L (ref 134–144)
Total Protein: 7.1 g/dL (ref 6.0–8.5)
eGFR: 92 mL/min/{1.73_m2} (ref >59)

## 2023-06-25 ENCOUNTER — Other Ambulatory Visit: Payer: Self-pay

## 2023-06-25 DIAGNOSIS — K760 Fatty (change of) liver, not elsewhere classified: Secondary | ICD-10-CM

## 2023-06-25 DIAGNOSIS — R7989 Other specified abnormal findings of blood chemistry: Secondary | ICD-10-CM

## 2023-06-27 LAB — HEPATIC FUNCTION PANEL
ALT: 126 IU/L — ABNORMAL HIGH (ref 0–32)
AST: 116 IU/L — ABNORMAL HIGH (ref 0–40)
Albumin: 5 g/dL — ABNORMAL HIGH (ref 3.8–4.8)
Alkaline Phosphatase: 72 IU/L (ref 44–121)
Bilirubin Total: 0.4 mg/dL (ref 0.0–1.2)
Bilirubin, Direct: 0.16 mg/dL (ref 0.00–0.40)
Total Protein: 7.2 g/dL (ref 6.0–8.5)

## 2023-07-01 ENCOUNTER — Ambulatory Visit: Payer: Self-pay | Admitting: Gastroenterology

## 2023-07-02 ENCOUNTER — Other Ambulatory Visit: Payer: Self-pay | Admitting: *Deleted

## 2023-07-02 DIAGNOSIS — R7989 Other specified abnormal findings of blood chemistry: Secondary | ICD-10-CM

## 2023-07-09 ENCOUNTER — Ambulatory Visit (HOSPITAL_COMMUNITY)
Admission: RE | Admit: 2023-07-09 | Discharge: 2023-07-09 | Disposition: A | Discharge status: Home / Self Care | Source: Ambulatory Visit | Attending: Gastroenterology | Admitting: Gastroenterology

## 2023-07-09 ENCOUNTER — Other Ambulatory Visit: Payer: Self-pay | Admitting: Gastroenterology

## 2023-07-09 DIAGNOSIS — R7989 Other specified abnormal findings of blood chemistry: Secondary | ICD-10-CM

## 2023-07-09 MED ORDER — GADOBUTROL 1 MMOL/ML IV SOLN
9.0000 mL | Freq: Once | INTRAVENOUS | Status: AC | PRN
  Administered 2023-07-09: 9 mL via INTRAVENOUS

## 2023-07-16 ENCOUNTER — Ambulatory Visit: Payer: Self-pay | Admitting: Gastroenterology

## 2023-07-21 ENCOUNTER — Other Ambulatory Visit: Payer: Self-pay | Admitting: *Deleted

## 2023-07-21 DIAGNOSIS — R7989 Other specified abnormal findings of blood chemistry: Secondary | ICD-10-CM

## 2023-07-21 DIAGNOSIS — K760 Fatty (change of) liver, not elsewhere classified: Secondary | ICD-10-CM

## 2023-11-05 ENCOUNTER — Inpatient Hospital Stay (HOSPITAL_COMMUNITY)

## 2023-11-05 ENCOUNTER — Emergency Department (HOSPITAL_COMMUNITY)

## 2023-11-05 ENCOUNTER — Encounter (HOSPITAL_COMMUNITY): Payer: Self-pay

## 2023-11-05 ENCOUNTER — Other Ambulatory Visit: Payer: Self-pay

## 2023-11-05 ENCOUNTER — Inpatient Hospital Stay (HOSPITAL_COMMUNITY)
Admission: EM | Admit: 2023-11-05 | Discharge: 2023-11-08 | DRG: 482 | Disposition: A | Discharge status: Home Health | Attending: Internal Medicine | Admitting: Internal Medicine

## 2023-11-05 DIAGNOSIS — Z87891 Personal history of nicotine dependence: Secondary | ICD-10-CM | POA: Diagnosis not present

## 2023-11-05 DIAGNOSIS — Z7984 Long term (current) use of oral hypoglycemic drugs: Secondary | ICD-10-CM

## 2023-11-05 DIAGNOSIS — E119 Type 2 diabetes mellitus without complications: Secondary | ICD-10-CM | POA: Diagnosis present

## 2023-11-05 DIAGNOSIS — E66811 Obesity, class 1: Secondary | ICD-10-CM | POA: Diagnosis present

## 2023-11-05 DIAGNOSIS — S72091A Other fracture of head and neck of right femur, initial encounter for closed fracture: Secondary | ICD-10-CM | POA: Diagnosis present

## 2023-11-05 DIAGNOSIS — W19XXXA Unspecified fall, initial encounter: Secondary | ICD-10-CM | POA: Diagnosis not present

## 2023-11-05 DIAGNOSIS — E1169 Type 2 diabetes mellitus with other specified complication: Secondary | ICD-10-CM

## 2023-11-05 DIAGNOSIS — E876 Hypokalemia: Secondary | ICD-10-CM | POA: Diagnosis present

## 2023-11-05 DIAGNOSIS — Z79899 Other long term (current) drug therapy: Secondary | ICD-10-CM

## 2023-11-05 DIAGNOSIS — Y92009 Unspecified place in unspecified non-institutional (private) residence as the place of occurrence of the external cause: Secondary | ICD-10-CM | POA: Diagnosis not present

## 2023-11-05 DIAGNOSIS — S72041A Displaced fracture of base of neck of right femur, initial encounter for closed fracture: Secondary | ICD-10-CM | POA: Diagnosis not present

## 2023-11-05 DIAGNOSIS — Z7982 Long term (current) use of aspirin: Secondary | ICD-10-CM

## 2023-11-05 DIAGNOSIS — E7800 Pure hypercholesterolemia, unspecified: Secondary | ICD-10-CM | POA: Diagnosis present

## 2023-11-05 DIAGNOSIS — I10 Essential (primary) hypertension: Secondary | ICD-10-CM | POA: Diagnosis present

## 2023-11-05 DIAGNOSIS — Z888 Allergy status to other drugs, medicaments and biological substances status: Secondary | ICD-10-CM | POA: Diagnosis not present

## 2023-11-05 DIAGNOSIS — R7989 Other specified abnormal findings of blood chemistry: Secondary | ICD-10-CM | POA: Diagnosis present

## 2023-11-05 DIAGNOSIS — Z6833 Body mass index (BMI) 33.0-33.9, adult: Secondary | ICD-10-CM | POA: Diagnosis not present

## 2023-11-05 DIAGNOSIS — J45909 Unspecified asthma, uncomplicated: Secondary | ICD-10-CM | POA: Diagnosis present

## 2023-11-05 DIAGNOSIS — S72001A Fracture of unspecified part of neck of right femur, initial encounter for closed fracture: Secondary | ICD-10-CM | POA: Diagnosis not present

## 2023-11-05 DIAGNOSIS — K219 Gastro-esophageal reflux disease without esophagitis: Secondary | ICD-10-CM | POA: Diagnosis present

## 2023-11-05 DIAGNOSIS — R531 Weakness: Secondary | ICD-10-CM | POA: Diagnosis present

## 2023-11-05 DIAGNOSIS — W010XXA Fall on same level from slipping, tripping and stumbling without subsequent striking against object, initial encounter: Secondary | ICD-10-CM | POA: Diagnosis present

## 2023-11-05 DIAGNOSIS — R4781 Slurred speech: Secondary | ICD-10-CM

## 2023-11-05 DIAGNOSIS — S72009A Fracture of unspecified part of neck of unspecified femur, initial encounter for closed fracture: Principal | ICD-10-CM | POA: Diagnosis present

## 2023-11-05 LAB — COMPREHENSIVE METABOLIC PANEL WITH GFR
ALT: 105 U/L — ABNORMAL HIGH (ref 0–44)
AST: 98 U/L — ABNORMAL HIGH (ref 15–41)
Albumin: 4.6 g/dL (ref 3.5–5.0)
Alkaline Phosphatase: 47 U/L (ref 38–126)
Anion gap: 15 (ref 5–15)
BUN: 21 mg/dL (ref 8–23)
CO2: 23 mmol/L (ref 22–32)
Calcium: 9.6 mg/dL (ref 8.9–10.3)
Chloride: 101 mmol/L (ref 98–111)
Creatinine, Ser: 0.71 mg/dL (ref 0.44–1.00)
GFR, Estimated: >60 mL/min (ref >60)
Glucose, Bld: 122 mg/dL — ABNORMAL HIGH (ref 70–99)
Potassium: 3 mmol/L — ABNORMAL LOW (ref 3.5–5.1)
Sodium: 139 mmol/L (ref 135–145)
Total Bilirubin: 1.1 mg/dL (ref 0.0–1.2)
Total Protein: 7.8 g/dL (ref 6.5–8.1)

## 2023-11-05 LAB — I-STAT CHEM 8, ED
BUN: 21 mg/dL (ref 8–23)
Calcium, Ion: 1.17 mmol/L (ref 1.15–1.40)
Chloride: 102 mmol/L (ref 98–111)
Creatinine, Ser: 0.8 mg/dL (ref 0.44–1.00)
Glucose, Bld: 123 mg/dL — ABNORMAL HIGH (ref 70–99)
HCT: 42 % (ref 36.0–46.0)
Hemoglobin: 14.3 g/dL (ref 12.0–15.0)
Potassium: 3 mmol/L — ABNORMAL LOW (ref 3.5–5.1)
Sodium: 140 mmol/L (ref 135–145)
TCO2: 23 mmol/L (ref 22–32)

## 2023-11-05 LAB — CBC
HCT: 42.3 % (ref 36.0–46.0)
Hemoglobin: 14.1 g/dL (ref 12.0–15.0)
MCH: 29.3 pg (ref 26.0–34.0)
MCHC: 33.3 g/dL (ref 30.0–36.0)
MCV: 87.8 fL (ref 80.0–100.0)
Platelets: 194 K/uL (ref 150–400)
RBC: 4.82 MIL/uL (ref 3.87–5.11)
RDW: 13.2 % (ref 11.5–15.5)
WBC: 6.2 K/uL (ref 4.0–10.5)
nRBC: 0 % (ref 0.0–0.2)

## 2023-11-05 LAB — DIFFERENTIAL
Abs Immature Granulocytes: 0.02 K/uL (ref 0.00–0.07)
Basophils Absolute: 0 K/uL (ref 0.0–0.1)
Basophils Relative: 1 %
Eosinophils Absolute: 0.1 K/uL (ref 0.0–0.5)
Eosinophils Relative: 2 %
Immature Granulocytes: 0 %
Lymphocytes Relative: 53 %
Lymphs Abs: 3.3 K/uL (ref 0.7–4.0)
Monocytes Absolute: 0.5 K/uL (ref 0.1–1.0)
Monocytes Relative: 7 %
Neutro Abs: 2.3 K/uL (ref 1.7–7.7)
Neutrophils Relative %: 37 %

## 2023-11-05 LAB — PROTIME-INR
INR: 1 (ref 0.8–1.2)
Prothrombin Time: 13.4 s (ref 11.4–15.2)

## 2023-11-05 LAB — APTT: aPTT: 25 s (ref 24–36)

## 2023-11-05 LAB — ETHANOL: Alcohol, Ethyl (B): <15 mg/dL (ref <15)

## 2023-11-05 LAB — CBG MONITORING, ED
Glucose-Capillary: 127 mg/dL — ABNORMAL HIGH (ref 70–99)
Glucose-Capillary: 115 mg/dL — ABNORMAL HIGH (ref 70–99)

## 2023-11-05 LAB — GLUCOSE, CAPILLARY: Glucose-Capillary: 167 mg/dL — ABNORMAL HIGH (ref 70–99)

## 2023-11-05 MED ORDER — MUPIROCIN 2 % EX OINT
1.0000 | TOPICAL_OINTMENT | Freq: Two times a day (BID) | CUTANEOUS | Status: DC
  Administered 2023-11-06 – 2023-11-08 (×5): 1 via NASAL
  Filled 2023-11-05: qty 22

## 2023-11-05 MED ORDER — HYDROMORPHONE HCL 1 MG/ML IJ SOLN
0.5000 mg | Freq: Once | INTRAMUSCULAR | Status: AC
  Administered 2023-11-05: 0.5 mg via INTRAVENOUS
  Filled 2023-11-05: qty 0.5

## 2023-11-05 MED ORDER — POTASSIUM CHLORIDE 10 MEQ/100ML IV SOLN
10.0000 meq | Freq: Once | INTRAVENOUS | Status: AC
  Administered 2023-11-05: 10 meq via INTRAVENOUS
  Filled 2023-11-05: qty 100

## 2023-11-05 MED ORDER — INSULIN ASPART 100 UNIT/ML IJ SOLN
0.0000 [IU] | INTRAMUSCULAR | Status: DC
  Administered 2023-11-05 – 2023-11-06 (×3): 4 [IU] via SUBCUTANEOUS
  Administered 2023-11-06 (×2): 3 [IU] via SUBCUTANEOUS
  Administered 2023-11-07 (×4): 4 [IU] via SUBCUTANEOUS
  Administered 2023-11-07: 3 [IU] via SUBCUTANEOUS
  Administered 2023-11-08: 4 [IU] via SUBCUTANEOUS
  Administered 2023-11-08 (×2): 3 [IU] via SUBCUTANEOUS

## 2023-11-05 MED ORDER — ALBUTEROL SULFATE (2.5 MG/3ML) 0.083% IN NEBU: 2.5000 mg | INHALATION_SOLUTION | RESPIRATORY_TRACT | Status: DC | PRN

## 2023-11-05 MED ORDER — MORPHINE SULFATE (PF) 2 MG/ML IV SOLN
2.0000 mg | INTRAVENOUS | Status: DC | PRN
  Administered 2023-11-05 – 2023-11-08 (×10): 2 mg via INTRAVENOUS
  Filled 2023-11-05 (×11): qty 1

## 2023-11-05 MED ORDER — POTASSIUM CHLORIDE IN NACL 40-0.9 MEQ/L-% IV SOLN
INTRAVENOUS | Status: AC
  Filled 2023-11-05 (×2): qty 1000

## 2023-11-05 MED ORDER — ACETAMINOPHEN 650 MG RE SUPP: 650.0000 mg | Freq: Four times a day (QID) | RECTAL | Status: DC | PRN

## 2023-11-05 MED ORDER — TRANEXAMIC ACID-NACL 1000-0.7 MG/100ML-% IV SOLN
1000.0000 mg | INTRAVENOUS | Status: AC
  Administered 2023-11-06: 1000 mg via INTRAVENOUS

## 2023-11-05 MED ORDER — ACETAMINOPHEN 325 MG PO TABS
650.0000 mg | ORAL_TABLET | Freq: Four times a day (QID) | ORAL | Status: DC | PRN
  Administered 2023-11-06 – 2023-11-07 (×2): 650 mg via ORAL
  Filled 2023-11-05 (×2): qty 2

## 2023-11-05 MED ORDER — CEFAZOLIN SODIUM-DEXTROSE 2-4 GM/100ML-% IV SOLN
2.0000 g | INTRAVENOUS | Status: AC
  Administered 2023-11-06: 2 g via INTRAVENOUS

## 2023-11-05 MED ORDER — ONDANSETRON HCL 4 MG/2ML IJ SOLN: 4.0000 mg | Freq: Four times a day (QID) | INTRAMUSCULAR | Status: DC | PRN

## 2023-11-05 MED ORDER — ONDANSETRON HCL 4 MG PO TABS: 4.0000 mg | ORAL_TABLET | Freq: Four times a day (QID) | ORAL | Status: DC | PRN

## 2023-11-05 MED ORDER — IOHEXOL 350 MG/ML SOLN
75.0000 mL | Freq: Once | INTRAVENOUS | Status: AC | PRN
  Administered 2023-11-05: 75 mL via INTRAVENOUS

## 2023-11-05 MED ORDER — STROKE: EARLY STAGES OF RECOVERY BOOK
Freq: Once | Status: DC
  Filled 2023-11-05: qty 1

## 2023-11-05 NOTE — Progress Notes (Signed)
 SLP Cancellation Note  Patient Details Name: Sherry Burgess MRN: [id number] DOB: Sep 06, 1950   Cancelled treatment:       Reason Eval/Treat Not Completed: SLP screened, no needs identified, will sign off. Speech, language and cognition are functioning at baseline at this time. Thank you for this referral,  [name] [name], CCC-SLP Speech Language Pathologist    [name] 11/05/2023, 3:18 PM

## 2023-11-05 NOTE — ED Notes (Addendum)
 Edp with pt at arrival 1328 To CT at 1329. Pt was in c collar.  Husband and daughter at bedside states thinks she was gritting her teeth with the slurred speech due to pain after falling.husband stated she does that sometimes. Pt

## 2023-11-05 NOTE — ED Notes (Signed)
 Arrived back at room with tele neuro [phone]

## 2023-11-05 NOTE — Consult Note (Signed)
 ORTHOPAEDIC CONSULTATION  REQUESTING PHYSICIAN: Suzette Pac, MD  ASSESSMENT AND PLAN: 73 y.o. female with the following: Right Hip Basicervical femoral neck fracture  This patient requires inpatient admission to the hospitalist, to include preoperative clearance and perioperative medical management  - Weight Bearing Status/Activity: NWB {right/left/bilateral:24895} lower extremity  - Additional recommended labs/tests: {Preop Labs:24897}  -VTE Prophylaxis: Please hold prior to OR; to resume POD#1 at the discretion of the primary team  - Pain control: Recommend PO pain medications PRN; judicious use of narcotics  - Follow-up plan: F/u 10-14 days postop  -Procedures: Plan for OR once patient has been medically optimized  Plan for {right/left/bilateral:24895} {Hip Fracture Surgery:24898}     Chief Complaint: {right/left/bilateral:24895} hip pain  HPI: Sherry Burgess is a 73 y.o. female who presented to the ED for evaluation after sustaining a mechanical fall.  @he danna is complaining of {right/left/bilateral:24895} hip pain.   Past Medical History:  Diagnosis Date   Asthma    Diabetes mellitus without complication (HCC)    GERD (gastroesophageal reflux disease)    Hypercholesterolemia    Hypertension    Past Surgical History:  Procedure Laterality Date   COLONOSCOPY N/A 05/16/2017   four simple adenomas, surveillanc 2022   COLONOSCOPY WITH PROPOFOL  N/A 02/07/2020    prep of colon was fair. Non-bleeding internal hemorrhoids. Sigmoid and descending colon diverticulosis, one 2 mm polyp in transverse colon. Tubular adenoma. Surveillance 5 years.    DILATION AND CURETTAGE OF UTERUS     OVARY SURGERY     POLYPECTOMY  05/16/2017   Procedure: POLYPECTOMY;  Surgeon: Harvey Margo CROME, MD;  Location: [facility];  Service: Endoscopy;;  cecal x2 ; hepatic flexure x2   POLYPECTOMY  02/07/2020   Procedure: POLYPECTOMY;  Surgeon: Cindie Carlin POUR, DO;  Location: [facility];   Service: Endoscopy;;   Social History   Socioeconomic History   Marital status: Married    Spouse name: Not on file   Number of children: Not on file   Years of education: Not on file   Highest education level: Not on file  Occupational History   Occupation: retired  Tobacco Use   Smoking status: Former   Smokeless tobacco: Never  Advertising account planner   Vaping status: Never Used  Substance and Sexual Activity   Alcohol use: No   Drug use: Never   Sexual activity: Not Currently  Other Topics Concern   Not on file  Social History Narrative   Not on file   Social Drivers of Health   Financial Resource Strain: Not on file  Food Insecurity: Not on file  Transportation Needs: Not on file  Physical Activity: Not on file  Stress: Not on file  Social Connections: Not on file   Family History  Problem Relation Age of Onset   Colon cancer Paternal Uncle    Colon polyps Neg Hx    Allergies  Allergen Reactions   Statins Other (See Comments)    Myopathies    Canagliflozin Itching   Empagliflozin Itching   Prior to Admission medications   Medication Sig Start Date End Date Taking? Authorizing Provider  Acetaminophen  325 MG CAPS Take 325 mg by mouth daily as needed.    [provider]  aspirin  EC 81 MG tablet Take 81 mg by mouth daily.    [provider]  Cholecalciferol (VITAMIN D3) 50 MCG (2000 UT) TABS Take 2,000 Units by mouth daily.    [provider]  Continuous Glucose Sensor (FREESTYLE  LIBRE 2 SENSOR) MISC  05/22/23   [provider]  fluticasone (FLONASE) 50 MCG/ACT nasal spray Place into both nostrils as needed for allergies or rhinitis.    [provider]  gemfibrozil (LOPID) 600 MG tablet Take 600 mg by mouth 2 (two) times daily. 11/20/22   [provider]  glipiZIDE (GLUCOTROL XL) 10 MG 24 hr tablet Take 10 mg by mouth daily. 11/10/19   [provider]  hydrochlorothiazide (HYDRODIURIL) 25 MG tablet Take 25 mg by  mouth daily.    [provider]  LEVEMIR  FLEXPEN 100 UNIT/ML FlexPen Inject 30 Units into the skin daily. 09/07/21   [provider]  losartan (COZAAR) 100 MG tablet Take 100 mg by mouth daily. 03/07/23   [provider]  metFORMIN (GLUCOPHAGE) 1000 MG tablet Take 1,000 mg by mouth daily.    [provider]  Multiple Vitamin (MULTIVITAMIN WITH MINERALS) TABS tablet Take 1 tablet by mouth daily.    [provider]  omeprazole (PRILOSEC) 10 MG capsule Take 10 mg by mouth daily as needed. 08/08/19   [provider]  TRESIBA FLEXTOUCH 100 UNIT/ML FlexTouch Pen Inject 20 Units into the skin at bedtime. 09/09/23   [provider]   DG Hip Unilat W or Wo Pelvis 2-3 Views Right Result Date: 11/05/2023 CLINICAL DATA:  Fall.  Right hip pain. EXAM: DG HIP (WITH OR WITHOUT PELVIS) 2-3V RIGHT COMPARISON:  None Available. FINDINGS: Minimally displaced fracture of the right femoral neck. No dislocation. The bones are osteopenic. Degenerative changes of the lower lumbar spine. The soft tissues are unremarkable. IMPRESSION: Fracture of the right femoral neck. Electronically Signed   By: Vanetta Chou M.D.   On: 11/05/2023 14:23   DG Chest 1 View Result Date: 11/05/2023 CLINICAL DATA:  Fall, slurred speech. EXAM: CHEST  1 VIEW COMPARISON:  February 12, 2014. FINDINGS: The heart size and mediastinal contours are within normal limits. Both lungs are clear. The visualized skeletal structures are unremarkable. IMPRESSION: No active disease. Electronically Signed   By: Lynwood Landy Raddle M.D.   On: 11/05/2023 14:22   CT Cervical Spine Wo Contrast Result Date: 11/05/2023 CLINICAL DATA:  Neck trauma (Age >= 65y).  Fall. EXAM: CT CERVICAL SPINE WITHOUT CONTRAST TECHNIQUE: Multidetector CT imaging of the cervical spine was performed without intravenous contrast. Multiplanar CT image reconstructions were also generated. RADIATION DOSE REDUCTION: This exam was performed  according to the departmental dose-optimization program which includes automated exposure control, adjustment of the mA and/or kV according to patient size and/or use of iterative reconstruction technique. COMPARISON:  None Available. FINDINGS: Alignment: Cervical spine straightening.  No significant listhesis. Skull base and vertebrae: No acute fracture or suspicious lesion. Soft tissues and spinal canal: No prevertebral fluid or swelling. No visible canal hematoma. Disc levels: Moderate cervical disc degeneration, greatest from C4-5 through C6-7. Predominantly upper cervical facet arthrosis, moderately severe on the left at C2-3. No evidence of high-grade spinal canal stenosis. Upper chest: Clear lung apices. Other: None. IMPRESSION: 1. No acute cervical spine fracture. 2. Moderate cervical disc and facet degeneration. Electronically Signed   By: Dasie Hamburg M.D.   On: 11/05/2023 14:03   CT HEAD CODE STROKE WO CONTRAST Result Date: 11/05/2023 CLINICAL DATA:  Code stroke. Neuro deficit, acute, stroke suspected. Slurred speech and weakness. EXAM: CT HEAD WITHOUT CONTRAST TECHNIQUE: Contiguous axial images were obtained from the base of the skull through the vertex without intravenous contrast. RADIATION DOSE REDUCTION: This exam was performed according to  the departmental dose-optimization program which includes automated exposure control, adjustment of the mA and/or kV according to patient size and/or use of iterative reconstruction technique. COMPARISON:  None available. FINDINGS: Brain: There is no evidence of an acute infarct, intracranial hemorrhage, mass, midline shift, or extra-axial fluid collection. Cerebral volume is normal. The ventricles are normal in size. Patchy a confluent hypodensities in the cerebral white matter are nonspecific but compatible with moderate chronic small vessel ischemic disease. Vascular: Calcified atherosclerosis at the skull base. No hyperdense vessel. Skull: No fracture or  suspicious lesion. Sinuses/Orbits: Visualized paranasal sinuses and mastoid air cells are clear. Unremarkable orbits. Other: None. ASPECTS (Alberta Stroke Program Early CT Score) - Ganglionic level infarction (caudate, lentiform nuclei, internal capsule, insula, M1-M3 cortex): 7 - Supraganglionic infarction (M4-M6 cortex): 3 Total score (0-10 with 10 being normal): 10 These results were communicated to Dr. Germaine at 1:59 pm on 11/05/2023 by text page via the [facility] messaging system. IMPRESSION: 1. No evidence of acute intracranial abnormality. ASPECTS of 10. 2. Moderate chronic small vessel ischemic disease. Electronically Signed   By: Dasie Hamburg M.D.   On: 11/05/2023 14:00   Family History Reviewed and non-contributory, no pertinent history of problems with bleeding or anesthesia    Review of Systems No fevers or chills*** No numbness or tingling No chest pain No shortness of breath No bowel or bladder dysfunction No GI distress No headaches    OBJECTIVE  Vitals:Patient Vitals for the past 8 hrs:  BP Temp Temp src Pulse Resp SpO2 Height Weight  11/05/23 1430 (!) 164/87 -- -- 79 18 96 % -- --  11/05/23 1355 -- -- -- -- (!) 23 -- -- --  11/05/23 1350 (!) 155/83 97.6 F (36.4 C) Oral 70 -- 97 % -- --  11/05/23 1300 -- -- -- -- -- -- 5' 4 (1.626 m) 88.7 kg   ***General: Alert, no acute distress Cardiovascular: Extremities are warm Respiratory: No cyanosis, no use of accessory musculature ***Skin: No lesions in the area of chief complaint  Neurologic: ***Sensation intact distally  Psychiatric: Patient is competent for consent with normal mood and affect*** Lymphatic: No swelling obvious and reported other than the area involved in the exam below Extremities  ***LE: Extremity held in a fixed position.  ROM deferred due to known fracture.  Sensation is intact distally in the sural, saphenous, DP, SP, and plantar nerve distribution. 2+ DP pulse.  Toes are WWP.  Active motion intact in  the TA/EHL/GS. ***LE: Sensation is intact distally in the sural, saphenous, DP, SP, and plantar nerve distribution. 2+ DP pulse.  Toes are WWP.  Active motion intact in the TA/EHL/GS. Tolerates gentle ROM of the hip.  No pain with axial loading.     Test Results Imaging XR of the {right/left/bilateral:24895} demonstrates a {Hip Fracture Pattern:24896}.  Labs cbc Recent Labs    11/05/23 1333 11/05/23 1335  WBC 6.2  --   HGB 14.1 14.3  HCT 42.3 42.0  PLT 194  --     Labs inflam No results for input(s): CRP in the last 72 hours.  Invalid input(s): ESR  Labs coag Recent Labs    11/05/23 1333  INR 1.0    Recent Labs    11/05/23 1333 11/05/23 1335  NA 139 140  K 3.0* 3.0*  CL 101 102  CO2 23  --   GLUCOSE 122* 123*  BUN 21 21  CREATININE 0.71 0.80  CALCIUM 9.6  --

## 2023-11-05 NOTE — ED Provider Notes (Signed)
 [facility] Provider Note   CSN: [id number] Arrival date & time: 11/05/23  1328  An emergency department physician performed an initial assessment on this suspected stroke patient at 1328.  Patient presents with: Code Stroke   Sherry Burgess is a 73 y.o. female.  {Add pertinent medical, surgical, social history, OB history to YEP:67052} Patient has a history of diabetes and hypertension.  She tripped over a bag today.  She was brought in by paramedics with mild slurred speech initially and weakness on the right side.  She never lost consciousness   Fall       Prior to Admission medications   Medication Sig Start Date End Date Taking? Authorizing Provider  Acetaminophen  325 MG CAPS Take 325 mg by mouth daily as needed.    [provider]  aspirin  EC 81 MG tablet Take 81 mg by mouth daily.    [provider]  Cholecalciferol (VITAMIN D3) 50 MCG (2000 UT) TABS Take 2,000 Units by mouth daily.    [provider]  Continuous Glucose Sensor (FREESTYLE LIBRE 2 SENSOR) MISC  05/22/23   [provider]  fluticasone (FLONASE) 50 MCG/ACT nasal spray Place into both nostrils as needed for allergies or rhinitis.    [provider]  gemfibrozil (LOPID) 600 MG tablet Take 600 mg by mouth 2 (two) times daily. 11/20/22   [provider]  glipiZIDE (GLUCOTROL XL) 10 MG 24 hr tablet Take 10 mg by mouth daily. 11/10/19   [provider]  hydrochlorothiazide (HYDRODIURIL) 25 MG tablet Take 25 mg by mouth daily.    [provider]  LEVEMIR  FLEXPEN 100 UNIT/ML FlexPen Inject 30 Units into the skin daily. 09/07/21   [provider]  losartan (COZAAR) 100 MG tablet Take 100 mg by mouth daily. 03/07/23   [provider]  metFORMIN (GLUCOPHAGE) 1000 MG tablet Take 1,000 mg by mouth daily.    [provider]  Multiple Vitamin (MULTIVITAMIN WITH MINERALS) TABS tablet Take  1 tablet by mouth daily.    [provider]  omeprazole (PRILOSEC) 10 MG capsule Take 10 mg by mouth daily as needed. 08/08/19   [provider]  TRESIBA FLEXTOUCH 100 UNIT/ML FlexTouch Pen Inject 20 Units into the skin at bedtime. 09/09/23   [provider]    Allergies: Statins, Canagliflozin, and Empagliflozin    Review of Systems  Updated Vital Signs BP (!) 142/97   Pulse 69   Temp 97.6 F (36.4 C) (Oral)   Resp (!) 23   Ht 5' 4 (1.626 m)   Wt 88.7 kg   SpO2 100%   BMI 33.57 kg/m   Physical Exam  (all labs ordered are listed, but only abnormal results are displayed) Labs Reviewed  COMPREHENSIVE METABOLIC PANEL WITH GFR - Abnormal; Notable for the following components:      Result Value   Potassium 3.0 (*)    Glucose, Bld 122 (*)    AST 98 (*)    ALT 105 (*)    All other components within normal limits  I-STAT CHEM 8, ED - Abnormal; Notable for the following components:   Potassium 3.0 (*)    Glucose, Bld 123 (*)    All other components within normal limits  CBG MONITORING, ED - Abnormal; Notable for the following components:   Glucose-Capillary 127 (*)    All other components within normal limits  ETHANOL  PROTIME-INR  APTT  CBC  DIFFERENTIAL  RAPID  URINE DRUG SCREEN, HOSP PERFORMED    EKG: None  Radiology: CT ANGIO HEAD NECK W WO CM (CODE STROKE) Result Date: 11/05/2023 CLINICAL DATA:  Neuro deficit, acute, stroke suspected. Slurred speech and right arm weakness. EXAM: CT ANGIOGRAPHY HEAD AND NECK WITH AND WITHOUT CONTRAST TECHNIQUE: Multidetector CT imaging of the head and neck was performed using the standard protocol during bolus administration of intravenous contrast. Multiplanar CT image reconstructions and MIPs were obtained to evaluate the vascular anatomy. Carotid stenosis measurements (when applicable) are obtained utilizing NASCET criteria, using the distal internal carotid diameter as the denominator. RADIATION DOSE  REDUCTION: This exam was performed according to the departmental dose-optimization program which includes automated exposure control, adjustment of the mA and/or kV according to patient size and/or use of iterative reconstruction technique. CONTRAST:  75mL OMNIPAQUE  IOHEXOL  350 MG/ML SOLN COMPARISON:  None Available. FINDINGS: CTA NECK FINDINGS Aortic arch: Normal variant aortic arch branching pattern with common origin of the brachiocephalic and left common carotid arteries. Mild atherosclerosis without a significant stenosis of the arch vessel origins. Right carotid system: Patent with mixed plaque at the carotid bifurcation. No evidence of a significant stenosis or dissection. Left carotid system: Patent with minimal mixed plaque at the carotid bifurcation. No evidence of a significant stenosis or dissection. Vertebral arteries: Patent and codominant. Mild plaque without evidence of a significant stenosis or dissection. Skeleton: No acute osseous abnormality or suspicious lesion. Cervical spine reported separately. Other neck: No evidence of cervical lymphadenopathy or mass. Upper chest: Clear lung apices. Review of the MIP images confirms the above findings CTA HEAD FINDINGS Anterior circulation: The intracranial internal carotid arteries are patent with extensive atherosclerosis resulting in multiple stenoses bilaterally including severe stenoses of the right last surround, bilateral cavernous, and left ophthalmic segments. ACAs and MCAs are patent without evidence of a proximal branch occlusion or significant proximal stenosis. The left A1 segment is dominant. Posterior circulation: The intracranial vertebral arteries are patent to the basilar with asymmetric left-sided atherosclerosis resulting in severe left distal V4 stenosis. There is at least mild stenosis of the distal right V4 segment at the vertebrobasilar junction. The basilar artery is patent and irregular with no more than mild multifocal narrowing.  Posterior communicating arteries are diminutive or absent. Both PCAs are patent without evidence of a significant proximal stenosis. No aneurysm is identified. Venous sinuses: Patent. Anatomic variants: None of significance. Review of the MIP images confirms the above findings The finding of no large vessel occlusion was communicated to Dr. Germaine at 2:51 pm on 11/05/2023 by text page via the [facility] messaging system. IMPRESSION: 1. No large vessel occlusion. 2. Intracranial atherosclerosis including severe bilateral ICA and left V4 stenoses. 3. Widely patent cervical carotid arteries. 4.  Aortic Atherosclerosis (ICD10-I70.0). Electronically Signed   By: Dasie Hamburg M.D.   On: 11/05/2023 15:01   DG Hip Unilat W or Wo Pelvis 2-3 Views Right Result Date: 11/05/2023 CLINICAL DATA:  Fall.  Right hip pain. EXAM: DG HIP (WITH OR WITHOUT PELVIS) 2-3V RIGHT COMPARISON:  None Available. FINDINGS: Minimally displaced fracture of the right femoral neck. No dislocation. The bones are osteopenic. Degenerative changes of the lower lumbar spine. The soft tissues are unremarkable. IMPRESSION: Fracture of the right femoral neck. Electronically Signed   By: Vanetta Chou M.D.   On: 11/05/2023 14:23   DG Chest 1 View Result Date: 11/05/2023 CLINICAL DATA:  Fall, slurred speech. EXAM: CHEST  1 VIEW COMPARISON:  February 12, 2014. FINDINGS: The heart size and  mediastinal contours are within normal limits. Both lungs are clear. The visualized skeletal structures are unremarkable. IMPRESSION: No active disease. Electronically Signed   By: Lynwood Landy Raddle M.D.   On: 11/05/2023 14:22   CT Cervical Spine Wo Contrast Result Date: 11/05/2023 CLINICAL DATA:  Neck trauma (Age >= 65y).  Fall. EXAM: CT CERVICAL SPINE WITHOUT CONTRAST TECHNIQUE: Multidetector CT imaging of the cervical spine was performed without intravenous contrast. Multiplanar CT image reconstructions were also generated. RADIATION DOSE REDUCTION: This exam was  performed according to the departmental dose-optimization program which includes automated exposure control, adjustment of the mA and/or kV according to patient size and/or use of iterative reconstruction technique. COMPARISON:  None Available. FINDINGS: Alignment: Cervical spine straightening.  No significant listhesis. Skull base and vertebrae: No acute fracture or suspicious lesion. Soft tissues and spinal canal: No prevertebral fluid or swelling. No visible canal hematoma. Disc levels: Moderate cervical disc degeneration, greatest from C4-5 through C6-7. Predominantly upper cervical facet arthrosis, moderately severe on the left at C2-3. No evidence of high-grade spinal canal stenosis. Upper chest: Clear lung apices. Other: None. IMPRESSION: 1. No acute cervical spine fracture. 2. Moderate cervical disc and facet degeneration. Electronically Signed   By: Dasie Hamburg M.D.   On: 11/05/2023 14:03   CT HEAD CODE STROKE WO CONTRAST Result Date: 11/05/2023 CLINICAL DATA:  Code stroke. Neuro deficit, acute, stroke suspected. Slurred speech and weakness. EXAM: CT HEAD WITHOUT CONTRAST TECHNIQUE: Contiguous axial images were obtained from the base of the skull through the vertex without intravenous contrast. RADIATION DOSE REDUCTION: This exam was performed according to the departmental dose-optimization program which includes automated exposure control, adjustment of the mA and/or kV according to patient size and/or use of iterative reconstruction technique. COMPARISON:  None available. FINDINGS: Brain: There is no evidence of an acute infarct, intracranial hemorrhage, mass, midline shift, or extra-axial fluid collection. Cerebral volume is normal. The ventricles are normal in size. Patchy a confluent hypodensities in the cerebral white matter are nonspecific but compatible with moderate chronic small vessel ischemic disease. Vascular: Calcified atherosclerosis at the skull base. No hyperdense vessel. Skull: No  fracture or suspicious lesion. Sinuses/Orbits: Visualized paranasal sinuses and mastoid air cells are clear. Unremarkable orbits. Other: None. ASPECTS (Alberta Stroke Program Early CT Score) - Ganglionic level infarction (caudate, lentiform nuclei, internal capsule, insula, M1-M3 cortex): 7 - Supraganglionic infarction (M4-M6 cortex): 3 Total score (0-10 with 10 being normal): 10 These results were communicated to Dr. Germaine at 1:59 pm on 11/05/2023 by text page via the [facility] messaging system. IMPRESSION: 1. No evidence of acute intracranial abnormality. ASPECTS of 10. 2. Moderate chronic small vessel ischemic disease. Electronically Signed   By: Dasie Hamburg M.D.   On: 11/05/2023 14:00    {Document cardiac monitor, telemetry assessment procedure when appropriate:32947} Procedures   Medications Ordered in the ED  potassium chloride  10 mEq in 100 mL IVPB (has no administration in time range)  potassium chloride  10 mEq in 100 mL IVPB (10 mEq Intravenous New Bag/Given 11/05/23 1447)   stroke: early stages of recovery book (has no administration in time range)  insulin  aspart (novoLOG ) injection 0-20 Units (has no administration in time range)  0.9 % NaCl with KCl 40 mEq / L  infusion (has no administration in time range)  acetaminophen  (TYLENOL ) tablet 650 mg (has no administration in time range)    Or  acetaminophen  (TYLENOL ) suppository 650 mg (has no administration in time range)  morphine  (PF) 2 MG/ML injection 2  mg (has no administration in time range)  ondansetron  (ZOFRAN ) tablet 4 mg (has no administration in time range)    Or  ondansetron  (ZOFRAN ) injection 4 mg (has no administration in time range)  albuterol  (PROVENTIL ) (2.5 MG/3ML) 0.083% nebulizer solution 2.5 mg (has no administration in time range)  iohexol  (OMNIPAQUE ) 350 MG/ML injection 75 mL (75 mLs Intravenous Contrast Given 11/05/23 1417)  HYDROmorphone  (DILAUDID ) injection 0.5 mg (0.5 mg Intravenous Given 11/05/23 1446)  CRITICAL  CARE Performed by: Fairy Sermon Total critical care time: 45 minutes Critical care time was exclusive of separately billable procedures and treating other patients. Critical care was necessary to treat or prevent imminent or life-threatening deterioration. Critical care was time spent personally by me on the following activities: development of treatment plan with patient and/or surrogate as well as nursing, discussions with consultants, evaluation of patient's response to treatment, examination of patient, obtaining history from patient or surrogate, ordering and performing treatments and interventions, ordering and review of laboratory studies, ordering and review of radiographic studies, pulse oximetry and re-evaluation of patient's condition.  Patient seen by neurology and there was no determined that she did not have a stroke.  She will be admitted to medicine with orthopedics fixing her hip   {Click here for ABCD2, HEART and other calculators REFRESH Note before signing:1}                              Medical Decision Making Amount and/or Complexity of Data Reviewed Labs: ordered. Radiology: ordered.  Risk Prescription drug management. Decision regarding hospitalization.   Fall with fracture right hip  {Document critical care time when appropriate  Document review of labs and clinical decision tools ie CHADS2VASC2, etc  Document your independent review of radiology images and any outside records  Document your discussion with family members, caretakers and with consultants  Document social determinants of health affecting pt's care  Document your decision making why or why not admission, treatments were needed:32947:::1}   Final diagnoses:  None    ED Discharge Orders     None

## 2023-11-05 NOTE — ED Notes (Addendum)
 Pt just returned from xry/ct. pt states she does feel weak in right arm but did land on it as well. Pt speaking normal at this time now. Pain to right hip only with movement.

## 2023-11-05 NOTE — ED Notes (Signed)
 [facility] charting document at 1327 when pt arrival. Unable to fix in EMR.

## 2023-11-05 NOTE — ED Triage Notes (Addendum)
 Per EMS pt was LKW around 1145 at 1150 pt fell hit the back of her head on a metal filing cabinet and had sudden onset of slurred speech. Per EMS pt had intermittent rt arm weakness. VS 11/870 second BP 168/96. Pupils perrla.

## 2023-11-05 NOTE — ED Notes (Addendum)
 Pt has been able to wiggle toes on both legs since arrival. Unable to pick right one up due to  hip fracture. Attempted to raise left leg but hurt right hip. Neurologist was present during this initial Nih.

## 2023-11-05 NOTE — ED Notes (Signed)
 Unable to complete swallow screen at this time due to not being able to lift hob due to hip fx and pain

## 2023-11-05 NOTE — ED Notes (Signed)
 Pt taken to xray

## 2023-11-05 NOTE — ED Notes (Signed)
 MRI aware pt is ready.

## 2023-11-05 NOTE — Consult Note (Addendum)
 [facility] Telemedicine Consult   Requesting Provider: Suzette Consult Participants: Nurse, husband Location of the provider: [facility] Location of the patient: [facility]  This consult was provided via telemedicine with 2-way video and audio communication. The patient/family was informed that care would be provided in this way and agreed to receive care in this manner.    Chief Complaint: Slurred speech  HPI: 73 year old female who was at home caring for an infant.  When she put the infant down, tripped and fell backward from a bag that was on the floor.  Was noted after to have slurred speech.  Now also with hematoma forming occipitally and significant pain in the right hip.  LKW: 1145 on 11/05/2023 per EMS tnk given?: No, head injury, non-disabling symptoms IR Thrombectomy? No, no target lesion identified Modified Rankin Scale: 0-Completely asymptomatic and back to baseline post- stroke Time of teleneurologist evaluation: 1336  Exam: BP 168/96    CBG 127  General: In cervical collar.  Often grimacing in pain  1A: Level of Consciousness - 0 1B: Ask Month and Age - 1 1C: 'Blink Eyes' & 'Squeeze Hands' - 0 2: Test Horizontal Extraocular Movements - 0 3: Test Visual Fields - 0 4: Test Facial Palsy - 0 5A: Test Left Arm Motor Drift - 0 5B: Test Right Arm Motor Drift - 0 6A: Test Left Leg Motor Drift - peripheral leg movement noted but patient with pain in the right hip on moving either leg 6B: Test Right Leg Motor Drift - peripheral leg movement noted but patient with pain in the right hip on moving either leg 7: Test Limb Ataxia - 0 8: Test Sensation - 0 9: Test Language/Aphasia- 0 10: Test Dysarthria - 1 11: Test Extinction/Inattention - 0 NIHSS score: 2   Imaging Personally Reviewed:  CT HEAD WITHOUT CONTRAST   TECHNIQUE: Contiguous axial images were obtained from the base of the skull through the vertex without intravenous contrast.   RADIATION DOSE REDUCTION:  This exam was performed according to the departmental dose-optimization program which includes automated exposure control, adjustment of the mA and/or kV according to patient size and/or use of iterative reconstruction technique.   COMPARISON:  None available.   FINDINGS: Brain: There is no evidence of an acute infarct, intracranial hemorrhage, mass, midline shift, or extra-axial fluid collection. Cerebral volume is normal. The ventricles are normal in size. Patchy a confluent hypodensities in the cerebral white matter are nonspecific but compatible with moderate chronic small vessel ischemic disease.   Vascular: Calcified atherosclerosis at the skull base. No hyperdense vessel.   Skull: No fracture or suspicious lesion.   Sinuses/Orbits: Visualized paranasal sinuses and mastoid air cells are clear. Unremarkable orbits.   Other: None.   ASPECTS (Alberta Stroke Program Early CT Score)   - Ganglionic level infarction (caudate, lentiform nuclei, internal capsule, insula, M1-M3 cortex): 7   - Supraganglionic infarction (M4-M6 cortex): 3   Total score (0-10 with 10 being normal): 10   These results were communicated to Dr. Germaine at 1:59 pm on 11/05/2023 by text page via the [facility] messaging system.   IMPRESSION: 1. No evidence of acute intracranial abnormality. ASPECTS of 10. 2. Moderate chronic small vessel ischemic disease.  CTA of the Head and Neck: CT ANGIOGRAPHY HEAD AND NECK WITH AND WITHOUT CONTRAST   TECHNIQUE: Multidetector CT imaging of the head and neck was performed using the standard protocol during bolus administration of intravenous contrast. Multiplanar CT image reconstructions and MIPs were obtained to  evaluate the vascular anatomy. Carotid stenosis measurements (when applicable) are obtained utilizing NASCET criteria, using the distal internal carotid diameter as the denominator.   RADIATION DOSE REDUCTION: This exam was performed according to  the departmental dose-optimization program which includes automated exposure control, adjustment of the mA and/or kV according to patient size and/or use of iterative reconstruction technique.   CONTRAST:  75mL OMNIPAQUE  IOHEXOL  350 MG/ML SOLN   COMPARISON:  None Available.   FINDINGS: CTA NECK FINDINGS   Aortic arch: Normal variant aortic arch branching pattern with common origin of the brachiocephalic and left common carotid arteries. Mild atherosclerosis without a significant stenosis of the arch vessel origins.   Right carotid system: Patent with mixed plaque at the carotid bifurcation. No evidence of a significant stenosis or dissection.   Left carotid system: Patent with minimal mixed plaque at the carotid bifurcation. No evidence of a significant stenosis or dissection.   Vertebral arteries: Patent and codominant. Mild plaque without evidence of a significant stenosis or dissection.   Skeleton: No acute osseous abnormality or suspicious lesion. Cervical spine reported separately.   Other neck: No evidence of cervical lymphadenopathy or mass.   Upper chest: Clear lung apices.   Review of the MIP images confirms the above findings   CTA HEAD FINDINGS   Anterior circulation: The intracranial internal carotid arteries are patent with extensive atherosclerosis resulting in multiple stenoses bilaterally including severe stenoses of the right last surround, bilateral cavernous, and left ophthalmic segments. ACAs and MCAs are patent without evidence of a proximal branch occlusion or significant proximal stenosis. The left A1 segment is dominant.   Posterior circulation: The intracranial vertebral arteries are patent to the basilar with asymmetric left-sided atherosclerosis resulting in severe left distal V4 stenosis. There is at least mild stenosis of the distal right V4 segment at the vertebrobasilar junction. The basilar artery is patent and irregular with no  more than mild multifocal narrowing. Posterior communicating arteries are diminutive or absent. Both PCAs are patent without evidence of a significant proximal stenosis. No aneurysm is identified.   Venous sinuses: Patent.   Anatomic variants: None of significance.   Review of the MIP images confirms the above findings   The finding of no large vessel occlusion was communicated to Dr. Germaine at 2:51 pm on 11/05/2023 by text page via the [facility] messaging system.   IMPRESSION: 1. No large vessel occlusion. 2. Intracranial atherosclerosis including severe bilateral ICA and left V4 stenoses. 3. Widely patent cervical carotid arteries. 4.  Aortic Atherosclerosis (ICD10-I70.0).  Labs reviewed in epic and pertinent values follow: PTT 25 PT 13.4, INR 1.0   Assessment: 73 year old female with a history of asthma, DM, HLD and HTN who experienced a fall today.  Patient has full recollection of event.  Afterward noted to have slurred speech.  NIHSS of 2.  Head CT shows no evidence of acute changes or hemorrhage.  CTA of the head and neck shows no evidence of LVO.    Recommendations:  MRI of the brain without contrast since patient continues to have slurred speech per her account.   Agree with addressing right hip fracture ASA 325mg  daily until acute infarct ruled out per MR imaging.  Would have permissive BP parameters until acute infarct ruled out as well with no treatment of SBP<200.   Frequent neuro checks NPO until RN stroke swallow screen Telemetry monitoring    This patient is receiving care for possible acute neurological changes. Care by this provider at  the time of service, included time for direct evaluation via telemedicine, review of medical records, imaging studies and discussion of findings with providers, the patient and/or family.  Sonny Hock, MD [facility] [phone]  If 8pm- 8am, please page neurology on call as listed in AMION.

## 2023-11-05 NOTE — ED Notes (Signed)
Pt taken to MRI. Nad.

## 2023-11-05 NOTE — H&P (Signed)
 History and Physical    Sherry Burgess FMW:[id number] DOB: 05/05/1950 DOA: 11/05/2023  PCP: Luke Agent, MD (Inactive)   Patient coming from: Home  I have personally briefly reviewed patient's old medical records in [facility]  Chief Complaint: Fall  HPI: Sherry Burgess is a 73 y.o. female with medical history significant of hypertension, hyperlipidemia, diabetes mellitus type II and asthma presented with a fall today.  I reviewed patient's medical records including nursing notes and teleneurology consultation note myself.  Patient apparently was at home caring for an infant.  When she put the infant down, she tripped and fell backward from a bag that was on the floor.  She hit the back of her head on a metal filing cabinet and had sudden onset of slurred speech.  Per EMS, patient had intermittent right arm weakness.  No fever, abdominal pain, cough, shortness of breath, vomiting, diarrhea, dysuria or chest pain reported.  ED Course: Code stroke was called and patient was evaluated by teleneurology.  CT of the head was negative for acute intracranial abnormality.  Chest x-ray negative for acute active disease.  X-ray of right hip showed right femoral neck fracture.  CT of cervical spine showed no acute cervical spine fracture.  CTA of head and neck showed no evidence of LVO.  Teleneurology recommended MRI of the brain.  Patient's neurological symptoms were improving in the ED.  ED provider spoke to orthopedics on-call/Dr. Onesimo who will see the patient in consultation and is planning for possible OR in AM. Hospitalist service was called to evaluate the patient.  Review of Systems: As per HPI otherwise all other systems were reviewed and are negative.   Past Medical History:  Diagnosis Date   Asthma    Diabetes mellitus without complication (HCC)    GERD (gastroesophageal reflux disease)    Hypercholesterolemia    Hypertension     Past Surgical History:  Procedure Laterality  Date   COLONOSCOPY N/A 05/16/2017   four simple adenomas, surveillanc 2022   COLONOSCOPY WITH PROPOFOL  N/A 02/07/2020    prep of colon was fair. Non-bleeding internal hemorrhoids. Sigmoid and descending colon diverticulosis, one 2 mm polyp in transverse colon. Tubular adenoma. Surveillance 5 years.    DILATION AND CURETTAGE OF UTERUS     OVARY SURGERY     POLYPECTOMY  05/16/2017   Procedure: POLYPECTOMY;  Surgeon: Harvey Margo CROME, MD;  Location: [facility];  Service: Endoscopy;;  cecal x2 ; hepatic flexure x2   POLYPECTOMY  02/07/2020   Procedure: POLYPECTOMY;  Surgeon: Cindie Carlin POUR, DO;  Location: [facility];  Service: Endoscopy;;     reports that she has quit smoking. She has never used smokeless tobacco. She reports that she does not drink alcohol and does not use drugs.  Allergies  Allergen Reactions   Statins Other (See Comments)    Myopathies    Canagliflozin Itching   Empagliflozin Itching    Family History  Problem Relation Age of Onset   Colon cancer Paternal Uncle    Colon polyps Neg Hx     Prior to Admission medications   Medication Sig Start Date End Date Taking? Authorizing Provider  Acetaminophen  325 MG CAPS Take 325 mg by mouth daily as needed.    [provider]  aspirin  EC 81 MG tablet Take 81 mg by mouth daily.    [provider]  Cholecalciferol (VITAMIN D3) 50 MCG (2000 UT) TABS Take 2,000 Units by mouth daily.  [provider]  Continuous Glucose Sensor (FREESTYLE LIBRE 2 SENSOR) MISC  05/22/23   [provider]  fluticasone (FLONASE) 50 MCG/ACT nasal spray Place into both nostrils as needed for allergies or rhinitis.    [provider]  gemfibrozil (LOPID) 600 MG tablet Take 600 mg by mouth 2 (two) times daily. 11/20/22   [provider]  glipiZIDE (GLUCOTROL XL) 10 MG 24 hr tablet Take 10 mg by mouth daily. 11/10/19   [provider]  hydrochlorothiazide (HYDRODIURIL) 25 MG tablet Take  25 mg by mouth daily.    [provider]  LEVEMIR  FLEXPEN 100 UNIT/ML FlexPen Inject 30 Units into the skin daily. 09/07/21   [provider]  losartan (COZAAR) 100 MG tablet Take 100 mg by mouth daily. 03/07/23   [provider]  metFORMIN (GLUCOPHAGE) 1000 MG tablet Take 1,000 mg by mouth daily.    [provider]  Multiple Vitamin (MULTIVITAMIN WITH MINERALS) TABS tablet Take 1 tablet by mouth daily.    [provider]  omeprazole (PRILOSEC) 10 MG capsule Take 10 mg by mouth daily as needed. 08/08/19   [provider]  TRESIBA FLEXTOUCH 100 UNIT/ML FlexTouch Pen Inject 20 Units into the skin at bedtime. 09/09/23   [provider]    Physical Exam: Vitals:   11/05/23 1300 11/05/23 1350 11/05/23 1355 11/05/23 1430  BP:  (!) 155/83  (!) 164/87  Pulse:  70  79  Resp:   (!) 23 18  Temp:  97.6 F (36.4 C)    TempSrc:  Oral    SpO2:  97%  96%  Weight: 88.7 kg     Height: 5' 4 (1.626 m)       Constitutional: NAD, calm, comfortable. Vitals:   11/05/23 1300 11/05/23 1350 11/05/23 1355 11/05/23 1430  BP:  (!) 155/83  (!) 164/87  Pulse:  70  79  Resp:   (!) 23 18  Temp:  97.6 F (36.4 C)    TempSrc:  Oral    SpO2:  97%  96%  Weight: 88.7 kg     Height: 5' 4 (1.626 m)      Eyes: PERRL, lids and conjunctivae normal ENMT: Mucous membranes are moist. Posterior pharynx clear of any exudate or lesions. Neck: normal, supple, no masses, no thyromegaly Respiratory: bilateral decreased breath sounds at bases, no wheezing, no crackles.  Intermittently tachypneic.  No accessory muscle use.  Cardiovascular: S1 S2 positive, rate controlled. No extremity edema. 2+ pedal pulses.  Abdomen: no tenderness, no masses palpated. No hepatosplenomegaly. Bowel sounds positive.  Musculoskeletal: no clubbing / cyanosis.  Right hip tenderness present.   Skin: no rashes, lesions, ulcers. No induration Neurologic: CN 2-12 grossly intact. Moving  extremities. No focal neurologic deficits.  No seizure-like activity, slurring of speech noted. Psychiatric: Flat affect.  Not agitated.  Labs on Admission: I have personally reviewed following labs and imaging studies  CBC: Recent Labs  Lab 11/05/23 1333 11/05/23 1335  WBC 6.2  --   NEUTROABS 2.3  --   HGB 14.1 14.3  HCT 42.3 42.0  MCV 87.8  --   PLT 194  --    Basic Metabolic Panel: Recent Labs  Lab 11/05/23 1333 11/05/23 1335  NA 139 140  K 3.0* 3.0*  CL 101 102  CO2 23  --   GLUCOSE 122* 123*  BUN 21 21  CREATININE 0.71 0.80  CALCIUM 9.6  --    GFR: Estimated Creatinine Clearance: 68.5 mL/min (by  C-G formula based on SCr of 0.8 mg/dL). Liver Function Tests: Recent Labs  Lab 11/05/23 1333  AST 98*  ALT 105*  ALKPHOS 47  BILITOT 1.1  PROT 7.8  ALBUMIN 4.6   No results for input(s): LIPASE, AMYLASE in the last 168 hours. No results for input(s): AMMONIA in the last 168 hours. Coagulation Profile: Recent Labs  Lab 11/05/23 1333  INR 1.0   Cardiac Enzymes: No results for input(s): CKTOTAL, CKMB, CKMBINDEX, TROPONINI in the last 168 hours. BNP (last 3 results) No results for input(s): PROBNP in the last 8760 hours. HbA1C: No results for input(s): HGBA1C in the last 72 hours. CBG: Recent Labs  Lab 11/05/23 1330  GLUCAP 127*   Lipid Profile: No results for input(s): CHOL, HDL, LDLCALC, TRIG, CHOLHDL, LDLDIRECT in the last 72 hours. Thyroid  Function Tests: No results for input(s): TSH, T4TOTAL, FREET4, T3FREE, THYROIDAB in the last 72 hours. Anemia Panel: No results for input(s): VITAMINB12, FOLATE, FERRITIN, TIBC, IRON, RETICCTPCT in the last 72 hours. Urine analysis:    Component Value Date/Time   COLORURINE YELLOW 04/10/2015 0837   APPEARANCEUR CLEAR 04/10/2015 0837   LABSPEC 1.025 04/10/2015 0837   PHURINE 5.0 04/10/2015 0837   GLUCOSEU NEGATIVE 04/10/2015 0837   HGBUR TRACE (A) 04/10/2015  0837   BILIRUBINUR NEGATIVE 04/10/2015 0837   KETONESUR NEGATIVE 04/10/2015 0837   PROTEINUR NEGATIVE 04/10/2015 0837   UROBILINOGEN 0.2 02/12/2014 1108   NITRITE NEGATIVE 04/10/2015 0837   LEUKOCYTESUR TRACE (A) 04/10/2015 0837    Radiological Exams on Admission: DG Hip Unilat W or Wo Pelvis 2-3 Views Right Result Date: 11/05/2023 CLINICAL DATA:  Fall.  Right hip pain. EXAM: DG HIP (WITH OR WITHOUT PELVIS) 2-3V RIGHT COMPARISON:  None Available. FINDINGS: Minimally displaced fracture of the right femoral neck. No dislocation. The bones are osteopenic. Degenerative changes of the lower lumbar spine. The soft tissues are unremarkable. IMPRESSION: Fracture of the right femoral neck. Electronically Signed   By: Vanetta Chou M.D.   On: 11/05/2023 14:23   DG Chest 1 View Result Date: 11/05/2023 CLINICAL DATA:  Fall, slurred speech. EXAM: CHEST  1 VIEW COMPARISON:  February 12, 2014. FINDINGS: The heart size and mediastinal contours are within normal limits. Both lungs are clear. The visualized skeletal structures are unremarkable. IMPRESSION: No active disease. Electronically Signed   By: Lynwood Landy Raddle M.D.   On: 11/05/2023 14:22   CT Cervical Spine Wo Contrast Result Date: 11/05/2023 CLINICAL DATA:  Neck trauma (Age >= 65y).  Fall. EXAM: CT CERVICAL SPINE WITHOUT CONTRAST TECHNIQUE: Multidetector CT imaging of the cervical spine was performed without intravenous contrast. Multiplanar CT image reconstructions were also generated. RADIATION DOSE REDUCTION: This exam was performed according to the departmental dose-optimization program which includes automated exposure control, adjustment of the mA and/or kV according to patient size and/or use of iterative reconstruction technique. COMPARISON:  None Available. FINDINGS: Alignment: Cervical spine straightening.  No significant listhesis. Skull base and vertebrae: No acute fracture or suspicious lesion. Soft tissues and spinal canal: No prevertebral  fluid or swelling. No visible canal hematoma. Disc levels: Moderate cervical disc degeneration, greatest from C4-5 through C6-7. Predominantly upper cervical facet arthrosis, moderately severe on the left at C2-3. No evidence of high-grade spinal canal stenosis. Upper chest: Clear lung apices. Other: None. IMPRESSION: 1. No acute cervical spine fracture. 2. Moderate cervical disc and facet degeneration. Electronically Signed   By: Dasie Hamburg M.D.   On: 11/05/2023 14:03   CT HEAD CODE STROKE  WO CONTRAST Result Date: 11/05/2023 CLINICAL DATA:  Code stroke. Neuro deficit, acute, stroke suspected. Slurred speech and weakness. EXAM: CT HEAD WITHOUT CONTRAST TECHNIQUE: Contiguous axial images were obtained from the base of the skull through the vertex without intravenous contrast. RADIATION DOSE REDUCTION: This exam was performed according to the departmental dose-optimization program which includes automated exposure control, adjustment of the mA and/or kV according to patient size and/or use of iterative reconstruction technique. COMPARISON:  None available. FINDINGS: Brain: There is no evidence of an acute infarct, intracranial hemorrhage, mass, midline shift, or extra-axial fluid collection. Cerebral volume is normal. The ventricles are normal in size. Patchy a confluent hypodensities in the cerebral white matter are nonspecific but compatible with moderate chronic small vessel ischemic disease. Vascular: Calcified atherosclerosis at the skull base. No hyperdense vessel. Skull: No fracture or suspicious lesion. Sinuses/Orbits: Visualized paranasal sinuses and mastoid air cells are clear. Unremarkable orbits. Other: None. ASPECTS (Alberta Stroke Program Early CT Score) - Ganglionic level infarction (caudate, lentiform nuclei, internal capsule, insula, M1-M3 cortex): 7 - Supraganglionic infarction (M4-M6 cortex): 3 Total score (0-10 with 10 being normal): 10 These results were communicated to Dr. Germaine at 1:59  pm on 11/05/2023 by text page via the [facility] messaging system. IMPRESSION: 1. No evidence of acute intracranial abnormality. ASPECTS of 10. 2. Moderate chronic small vessel ischemic disease. Electronically Signed   By: Dasie Hamburg M.D.   On: 11/05/2023 14:00    EKG: Independently reviewed.  No ST elevations or depressions  Assessment/Plan  Right femoral neck fracture following a fall - Presented with a mechanical fall and imaging is as above.  Orthopedic/Dr. Onesimo planning for possible surgical intervention in a.m. if patient is medically stable. - Fall precautions.  Pain management.  N.p.o. in a.m.  Will need PT/OT eval after surgery.  Slurred speech - Happened after the fall.  Code stroke activated on presentation.  CT head and CTA of the head and neck negative.  Teleneurology recommending MRI of brain.  Symptoms have much improved.  Will also check 2D echo.  Hemoglobin A1c, fasting lipid profile.  N.p.o. until bedside swallow eval.  Permissive hypertension.  Frequent neurochecks.  Will hold off on aspirin  for now given hip fracture  Hypertension --monitor blood pressure.  Allow for permissive hypertension.  Hold antihypertensives for now  Hypokalemia -Replace.  Repeat a.m. labs  Diabetes mellitus type 2 - Hold oral regimen.  Check A1c.  CBGs with SSI  Elevated LFTs - Mild.  Questionable cause.  Improved from prior.  Repeat a.m. LFTs.  Outpatient follow-up with GI    DVT prophylaxis: SCDs Code Status: Full none at bedside Family Communication: Husband and daughter-in-law at bedside Disposition Plan: Pending clinical improvement Consults called: Orthopedics and teleneurology called by EDP Admission status: Inpatient/telemetry  Severity of Illness: The appropriate patient status for this patient is INPATIENT. Inpatient status is judged to be reasonable and necessary in order to provide the required intensity of service to ensure the patient's safety. The patient's presenting  symptoms, physical exam findings, and initial radiographic and laboratory data in the context of their chronic comorbidities is felt to place them at high risk for further clinical deterioration. Furthermore, it is not anticipated that the patient will be medically stable for discharge from the hospital within 2 midnights of admission.   * I certify that at the point of admission it is my clinical judgment that the patient will require inpatient hospital care spanning beyond 2 midnights from the point of admission  due to high intensity of service, high risk for further deterioration and high frequency of surveillance required.DEWAINE Sophie Mao MD [facility]  11/05/2023, 2:53 PM

## 2023-11-06 ENCOUNTER — Other Ambulatory Visit (HOSPITAL_COMMUNITY)

## 2023-11-06 ENCOUNTER — Encounter (HOSPITAL_COMMUNITY)
Admission: EM | Disposition: A | Discharge status: Home Health | Payer: Self-pay | Source: Home / Self Care | Attending: Internal Medicine

## 2023-11-06 ENCOUNTER — Inpatient Hospital Stay (HOSPITAL_COMMUNITY)

## 2023-11-06 ENCOUNTER — Inpatient Hospital Stay (HOSPITAL_COMMUNITY): Admitting: Certified Registered Nurse Anesthetist

## 2023-11-06 ENCOUNTER — Other Ambulatory Visit (HOSPITAL_COMMUNITY): Payer: Self-pay | Admitting: *Deleted

## 2023-11-06 ENCOUNTER — Encounter (HOSPITAL_COMMUNITY): Payer: Self-pay | Admitting: Internal Medicine

## 2023-11-06 DIAGNOSIS — S72001A Fracture of unspecified part of neck of right femur, initial encounter for closed fracture: Secondary | ICD-10-CM | POA: Diagnosis not present

## 2023-11-06 DIAGNOSIS — S72041A Displaced fracture of base of neck of right femur, initial encounter for closed fracture: Secondary | ICD-10-CM

## 2023-11-06 HISTORY — PX: INTRAMEDULLARY (IM) NAIL INTERTROCHANTERIC: SHX5875

## 2023-11-06 LAB — GLUCOSE, CAPILLARY
Glucose-Capillary: 100 mg/dL — ABNORMAL HIGH (ref 70–99)
Glucose-Capillary: 125 mg/dL — ABNORMAL HIGH (ref 70–99)
Glucose-Capillary: 149 mg/dL — ABNORMAL HIGH (ref 70–99)
Glucose-Capillary: 156 mg/dL — ABNORMAL HIGH (ref 70–99)
Glucose-Capillary: 184 mg/dL — ABNORMAL HIGH (ref 70–99)
Glucose-Capillary: 186 mg/dL — ABNORMAL HIGH (ref 70–99)
Glucose-Capillary: 95 mg/dL (ref 70–99)

## 2023-11-06 LAB — CBC
HCT: 38.3 % (ref 36.0–46.0)
Hemoglobin: 12.2 g/dL (ref 12.0–15.0)
MCH: 28.6 pg (ref 26.0–34.0)
MCHC: 31.9 g/dL (ref 30.0–36.0)
MCV: 89.7 fL (ref 80.0–100.0)
Platelets: 181 K/uL (ref 150–400)
RBC: 4.27 MIL/uL (ref 3.87–5.11)
RDW: 13.5 % (ref 11.5–15.5)
WBC: 6.6 K/uL (ref 4.0–10.5)
nRBC: 0 % (ref 0.0–0.2)

## 2023-11-06 LAB — COMPREHENSIVE METABOLIC PANEL WITH GFR
ALT: 74 U/L — ABNORMAL HIGH (ref 0–44)
AST: 61 U/L — ABNORMAL HIGH (ref 15–41)
Albumin: 3.7 g/dL (ref 3.5–5.0)
Alkaline Phosphatase: 39 U/L (ref 38–126)
Anion gap: 11 (ref 5–15)
BUN: 16 mg/dL (ref 8–23)
CO2: 23 mmol/L (ref 22–32)
Calcium: 8.6 mg/dL — ABNORMAL LOW (ref 8.9–10.3)
Chloride: 103 mmol/L (ref 98–111)
Creatinine, Ser: 0.73 mg/dL (ref 0.44–1.00)
GFR, Estimated: >60 mL/min (ref >60)
Glucose, Bld: 160 mg/dL — ABNORMAL HIGH (ref 70–99)
Potassium: 4 mmol/L (ref 3.5–5.1)
Sodium: 137 mmol/L (ref 135–145)
Total Bilirubin: 1 mg/dL (ref 0.0–1.2)
Total Protein: 6.4 g/dL — ABNORMAL LOW (ref 6.5–8.1)

## 2023-11-06 LAB — PROTIME-INR
INR: 1 (ref 0.8–1.2)
Prothrombin Time: 13.7 s (ref 11.4–15.2)

## 2023-11-06 LAB — LIPID PANEL
Cholesterol: 223 mg/dL — ABNORMAL HIGH (ref 0–200)
HDL: 25 mg/dL — ABNORMAL LOW (ref >40)
LDL Cholesterol: UNDETERMINED mg/dL (ref 0–99)
Total CHOL/HDL Ratio: 8.9 ratio
Triglycerides: 445 mg/dL — ABNORMAL HIGH (ref <150)
VLDL: UNDETERMINED mg/dL (ref 0–40)

## 2023-11-06 LAB — SURGICAL PCR SCREEN
MRSA, PCR: NEGATIVE
Staphylococcus aureus: NEGATIVE

## 2023-11-06 LAB — HEMOGLOBIN A1C
Hgb A1c MFr Bld: 8.1 % — ABNORMAL HIGH (ref 4.8–5.6)
Mean Plasma Glucose: 185.77 mg/dL

## 2023-11-06 SURGERY — FIXATION, FRACTURE, INTERTROCHANTERIC, WITH INTRAMEDULLARY ROD
Anesthesia: General | Site: Hip | Laterality: Right

## 2023-11-06 MED ORDER — FENTANYL CITRATE (PF) 250 MCG/5ML IJ SOLN
INTRAMUSCULAR | Status: DC | PRN
  Administered 2023-11-06: 25 ug via INTRAVENOUS
  Administered 2023-11-06 (×2): 50 ug via INTRAVENOUS
  Administered 2023-11-06 (×3): 25 ug via INTRAVENOUS

## 2023-11-06 MED ORDER — FENTANYL CITRATE (PF) 100 MCG/2ML IJ SOLN
INTRAMUSCULAR | Status: AC
  Filled 2023-11-06: qty 2

## 2023-11-06 MED ORDER — LIDOCAINE 2% (20 MG/ML) 5 ML SYRINGE
INTRAMUSCULAR | Status: DC | PRN
  Administered 2023-11-06: 60 mg via INTRAVENOUS

## 2023-11-06 MED ORDER — ONDANSETRON HCL 4 MG/2ML IJ SOLN: 4.0000 mg | Freq: Once | INTRAMUSCULAR | Status: DC | PRN

## 2023-11-06 MED ORDER — CHLORHEXIDINE GLUCONATE 0.12 % MT SOLN
15.0000 mL | Freq: Once | OROMUCOSAL | Status: AC
  Administered 2023-11-06: 15 mL via OROMUCOSAL

## 2023-11-06 MED ORDER — OXYCODONE HCL 5 MG PO TABS: 5.0000 mg | ORAL_TABLET | Freq: Once | ORAL | Status: DC | PRN

## 2023-11-06 MED ORDER — BUPIVACAINE HCL (PF) 0.5 % IJ SOLN
INTRAMUSCULAR | Status: AC
  Filled 2023-11-06: qty 30

## 2023-11-06 MED ORDER — TRANEXAMIC ACID-NACL 1000-0.7 MG/100ML-% IV SOLN
INTRAVENOUS | Status: AC
  Filled 2023-11-06: qty 100

## 2023-11-06 MED ORDER — BUPIVACAINE HCL (PF) 0.5 % IJ SOLN
INTRAMUSCULAR | Status: DC | PRN
  Administered 2023-11-06: 30 mL

## 2023-11-06 MED ORDER — SUCCINYLCHOLINE CHLORIDE 200 MG/10ML IV SOSY
PREFILLED_SYRINGE | INTRAVENOUS | Status: DC | PRN
  Administered 2023-11-06: 120 mg via INTRAVENOUS

## 2023-11-06 MED ORDER — SODIUM CHLORIDE 0.9 % IR SOLN
Status: DC | PRN
  Administered 2023-11-06: 1000 mL

## 2023-11-06 MED ORDER — PROPOFOL 10 MG/ML IV BOLUS
INTRAVENOUS | Status: DC | PRN
  Administered 2023-11-06: 50 mg via INTRAVENOUS
  Administered 2023-11-06: 150 mg via INTRAVENOUS

## 2023-11-06 MED ORDER — GLYCOPYRROLATE PF 0.2 MG/ML IJ SOSY
PREFILLED_SYRINGE | INTRAMUSCULAR | Status: DC | PRN
  Administered 2023-11-06: .2 mg via INTRAVENOUS

## 2023-11-06 MED ORDER — GLYCOPYRROLATE PF 0.2 MG/ML IJ SOSY
PREFILLED_SYRINGE | INTRAMUSCULAR | Status: AC
  Filled 2023-11-06: qty 1

## 2023-11-06 MED ORDER — FENTANYL CITRATE PF 50 MCG/ML IJ SOSY
25.0000 ug | PREFILLED_SYRINGE | INTRAMUSCULAR | Status: DC | PRN
  Administered 2023-11-06 (×2): 50 ug via INTRAVENOUS
  Filled 2023-11-06 (×2): qty 1

## 2023-11-06 MED ORDER — LACTATED RINGERS IV SOLN: INTRAVENOUS | Status: DC

## 2023-11-06 MED ORDER — PHENYLEPHRINE 80 MCG/ML (10ML) SYRINGE FOR IV PUSH (FOR BLOOD PRESSURE SUPPORT)
PREFILLED_SYRINGE | INTRAVENOUS | Status: AC
  Filled 2023-11-06: qty 10

## 2023-11-06 MED ORDER — PROPOFOL 10 MG/ML IV BOLUS
INTRAVENOUS | Status: AC
  Filled 2023-11-06: qty 20

## 2023-11-06 MED ORDER — DEXAMETHASONE SODIUM PHOSPHATE 10 MG/ML IJ SOLN
INTRAMUSCULAR | Status: AC
  Filled 2023-11-06: qty 1

## 2023-11-06 MED ORDER — ONDANSETRON HCL 4 MG/2ML IJ SOLN
INTRAMUSCULAR | Status: DC | PRN
  Administered 2023-11-06: 4 mg via INTRAVENOUS

## 2023-11-06 MED ORDER — OXYCODONE HCL 5 MG/5ML PO SOLN: 5.0000 mg | Freq: Once | ORAL | Status: DC | PRN

## 2023-11-06 MED ORDER — SUCCINYLCHOLINE CHLORIDE 200 MG/10ML IV SOSY
PREFILLED_SYRINGE | INTRAVENOUS | Status: AC
  Filled 2023-11-06: qty 10

## 2023-11-06 MED ORDER — CEFAZOLIN SODIUM-DEXTROSE 2-4 GM/100ML-% IV SOLN
INTRAVENOUS | Status: AC
  Filled 2023-11-06: qty 100

## 2023-11-06 MED ORDER — ONDANSETRON HCL 4 MG/2ML IJ SOLN
INTRAMUSCULAR | Status: AC
  Filled 2023-11-06: qty 2

## 2023-11-06 MED ORDER — PROPOFOL 500 MG/50ML IV EMUL
INTRAVENOUS | Status: AC
  Filled 2023-11-06: qty 50

## 2023-11-06 MED ORDER — LIDOCAINE 2% (20 MG/ML) 5 ML SYRINGE
INTRAMUSCULAR | Status: AC
  Filled 2023-11-06: qty 5

## 2023-11-06 MED ORDER — PHENYLEPHRINE 80 MCG/ML (10ML) SYRINGE FOR IV PUSH (FOR BLOOD PRESSURE SUPPORT)
PREFILLED_SYRINGE | INTRAVENOUS | Status: DC | PRN
  Administered 2023-11-06: 160 ug via INTRAVENOUS
  Administered 2023-11-06: 80 ug via INTRAVENOUS
  Administered 2023-11-06 (×2): 160 ug via INTRAVENOUS
  Administered 2023-11-06: 80 ug via INTRAVENOUS
  Administered 2023-11-06 (×5): 160 ug via INTRAVENOUS

## 2023-11-06 SURGICAL SUPPLY — 49 items
BIT DRILL 4.0X280 (BIT) IMPLANT
BLADE SURG SZ10 CARB STEEL (BLADE) ×1 IMPLANT
BNDG GAUZE DERMACEA FLUFF 4 (GAUZE/BANDAGES/DRESSINGS) ×1 IMPLANT
CHLORAPREP W/TINT 26 (MISCELLANEOUS) ×1 IMPLANT
COVER LIGHT HANDLE STERIS (MISCELLANEOUS) ×2 IMPLANT
COVER MAYO STAND XLG (MISCELLANEOUS) ×1 IMPLANT
COVER PERINEAL POST (MISCELLANEOUS) ×1 IMPLANT
DRAPE C-ARM FOLDED MOBILE STRL (DRAPES) IMPLANT
DRAPE STERI IOBAN 125X83 (DRAPES) ×1 IMPLANT
DRSG MEPILEX SACRM 8.7X9.8 (GAUZE/BANDAGES/DRESSINGS) ×1 IMPLANT
DRSG TEGADERM 4X4.75 (GAUZE/BANDAGES/DRESSINGS) ×4 IMPLANT
ELECTRODE REM PT RTRN 9FT ADLT (ELECTROSURGICAL) ×1 IMPLANT
GAUZE SPONGE 4X4 12PLY STRL (GAUZE/BANDAGES/DRESSINGS) ×1 IMPLANT
GAUZE XEROFORM 1X8 LF (GAUZE/BANDAGES/DRESSINGS) ×2 IMPLANT
GLOVE BIO SURGEON STRL SZ8 (GLOVE) ×2 IMPLANT
GLOVE BIOGEL PI IND STRL 7.0 (GLOVE) ×2 IMPLANT
GLOVE BIOGEL PI IND STRL 8 (GLOVE) ×1 IMPLANT
GOWN STRL REUS W/TWL LRG LVL3 (GOWN DISPOSABLE) ×1 IMPLANT
GOWN STRL REUS W/TWL XL LVL3 (GOWN DISPOSABLE) ×1 IMPLANT
GUIDEWIRE ORTH 900X3XBALL NOSE (WIRE) IMPLANT
KIT TURNOVER KIT A (KITS) ×1 IMPLANT
MANIFOLD NEPTUNE II (INSTRUMENTS) ×1 IMPLANT
MARKER SKIN DUAL TIP RULER LAB (MISCELLANEOUS) ×1 IMPLANT
MAT GRAY ABSORB FLUID 28X50 (MISCELLANEOUS) ×1 IMPLANT
NAIL IM TROCH 10X36 125D RT (Nail) IMPLANT
NDL HYPO 21X1.5 SAFETY (NEEDLE) ×1 IMPLANT
NDL SPNL 18GX3.5 QUINCKE PK (NEEDLE) IMPLANT
NEEDLE HYPO 21X1.5 SAFETY (NEEDLE) ×1 IMPLANT
NEEDLE SPNL 18GX3.5 QUINCKE PK (NEEDLE) ×1 IMPLANT
NS IRRIG 1000ML POUR BTL (IV SOLUTION) ×1 IMPLANT
PACK SRG BSC III STRL LF ECLPS (CUSTOM PROCEDURE TRAY) ×1 IMPLANT
PAD ABD 8X10 STRL (GAUZE/BANDAGES/DRESSINGS) ×1 IMPLANT
PAD ARMBOARD POSITIONER FOAM (MISCELLANEOUS) ×1 IMPLANT
PIN GUIDE THRD AR 3.2X330 (PIN) IMPLANT
POSITIONER HEAD 8X9X4 ADT (SOFTGOODS) ×1 IMPLANT
SCREW CORT CAPT 5.0X40 (Screw) IMPLANT
SCREW TELESC LAG 10.5X105 LT (Screw) IMPLANT
SPONGE T-LAP 18X18 ~~LOC~~+RFID (SPONGE) ×2 IMPLANT
STRIP CLOSURE SKIN 1/2X4 (GAUZE/BANDAGES/DRESSINGS) IMPLANT
SUT MNCRL AB 4-0 PS2 18 (SUTURE) IMPLANT
SUT MON AB 2-0 CT1 36 (SUTURE) ×1 IMPLANT
SUT VIC AB 0 CT1 27XBRD ANTBC (SUTURE) ×1 IMPLANT
SYR 20ML LL LF (SYRINGE) IMPLANT
SYR 30ML LL (SYRINGE) ×1 IMPLANT
SYR BULB IRRIG 60ML STRL (SYRINGE) ×2 IMPLANT
TOOL ACTIVATION (INSTRUMENTS) IMPLANT
TOWEL OR 17X26 4PK STRL BLUE (TOWEL DISPOSABLE) ×1 IMPLANT
TRAY FOLEY MTR SLVR 16FR STAT (SET/KITS/TRAYS/PACK) ×1 IMPLANT
YANKAUER SUCT BULB TIP 10FT TU (MISCELLANEOUS) ×1 IMPLANT

## 2023-11-06 NOTE — Anesthesia Procedure Notes (Signed)
 Procedure Name: Intubation Date/Time: 11/06/2023 9:30 AM  Performed by: Elaine Delon CROME, CRNAPre-anesthesia Checklist: Patient identified, Emergency Drugs available, Suction available and Patient being monitored Patient Re-evaluated:Patient Re-evaluated prior to induction Oxygen Delivery Method: Circle system utilized Preoxygenation: Pre-oxygenation with 100% oxygen Induction Type: IV induction Ventilation: Mask ventilation without difficulty Laryngoscope Size: Glidescope and 3 Grade View: Grade I Tube type: Oral Tube size: 7.0 mm Number of attempts: 1 Airway Equipment and Method: Stylet Placement Confirmation: ETT inserted through vocal cords under direct vision, positive ETCO2 and breath sounds checked- equal and bilateral Secured at: 21 cm Tube secured with: Tape Dental Injury: Teeth and Oropharynx as per pre-operative assessment

## 2023-11-06 NOTE — TOC Initial Note (Signed)
 Transition of Care [facility]) - Initial/Assessment Note    Patient Details  Name: Sherry Burgess MRN: [id number] Date of Birth: [birth year]  Transition of Care [facility]) CM/SW Contact:    Sherry Sherry Bigness, LCSW Phone Number: 11/06/2023, 1:16 PM  Clinical Narrative:                 Pt admitted for hip fracture. Pt from home with spouse. Pt does not currently receive home care services and ambulates independently at baseline. Pt has cane at home but, does not use it. CSW will continue to follow for discharge planning.  Expected Discharge Plan: Skilled Nursing Facility Barriers to Discharge: Continued Medical Work up   Patient Goals and CMS Choice Patient states their goals for this hospitalization and ongoing recovery are:: To get better to be able to go home CMS Medicare.gov Compare Post Acute Care list provided to:: Patient Choice offered to / list presented to : Patient [place] ownership interest in [facility]provided to::  (NA)    Expected Discharge Plan and Services In-house Referral: Clinical Social Work Discharge Planning Services: NA Post Acute Care Choice: Skilled Nursing Facility, Home Health, Durable Medical Equipment Living arrangements for the past 2 months: Single Family Home                                      Prior Living Arrangements/Services Living arrangements for the past 2 months: Single Family Home Lives with:: Spouse Patient language and need for interpreter reviewed:: Yes Do you feel safe going back to the place where you live?: Yes      Need for Family Participation in Patient Care: No (Comment) Care giver support system in place?: No (comment) Current home services: DME [name] Criminal Activity/Legal Involvement Pertinent to Current Situation/Hospitalization: No - Comment as needed  Activities of Daily Living   ADL Screening (condition at time of admission) Independently performs ADLs?: No Does the patient have a NEW difficulty  with bathing/dressing/toileting/self-feeding that is expected to last >3 days?: Yes (Initiates electronic notice to provider for possible OT consult) Does the patient have a NEW difficulty with getting in/out of bed, walking, or climbing stairs that is expected to last >3 days?: Yes (Initiates electronic notice to provider for possible PT consult) Does the patient have a NEW difficulty with communication that is expected to last >3 days?: No Is the patient deaf or have difficulty hearing?: No Does the patient have difficulty seeing, even when wearing glasses/contacts?: No Does the patient have difficulty concentrating, remembering, or making decisions?: No  Permission Sought/Granted Permission sought to share information with : Facility Medical sales representative, Family Supports Permission granted to share information with : Yes, Verbal Permission Granted  Share Information with NAME: Sherry Burgess (Spouse)  [phone]  Permission granted to share info w AGENCY: SNF's, HHA's        Emotional Assessment Appearance:: Appears stated age Attitude/Demeanor/Rapport: Engaged, Charismatic Affect (typically observed): Accepting, Pleasant Orientation: : Oriented to Self, Oriented to Place, Oriented to  Time, Oriented to Situation Alcohol / Substance Use: Not Applicable Psych Involvement: No (comment)  Admission diagnosis:  Hip fracture (HCC) [S72.009A] Patient Active Problem List   Diagnosis Date Noted   Hip fracture (HCC) 11/05/2023   Elevated LFTs 01/18/2020   History of colonic polyps 01/18/2020   Hepatic steatosis 07/13/2019   Special screening for malignant neoplasms, colon    DKA (diabetic ketoacidosis) (HCC) 02/12/2014  Thrush, oral 02/12/2014   Diabetes mellitus (HCC) 02/12/2014   PCP:  Luke Agent, MD (Inactive) Pharmacy:   [facility] [phone] - [place], [place] - [address] AT [facility] OF [address] [place] [address] Phone:  [phone] Fax: [phone]     Social Drivers of Health (SDOH) Social History: SDOH Screenings   Food Insecurity: No Food Insecurity (11/05/2023)  Housing: Low Risk  (11/05/2023)  Transportation Needs: No Transportation Needs (11/05/2023)  Utilities: Not At Risk (11/05/2023)  Social Connections: Moderately Integrated (11/05/2023)  Tobacco Use: Medium Risk (11/06/2023)   SDOH Interventions:     Readmission Risk Interventions    11/06/2023    1:13 PM  Readmission Risk Prevention Plan  Post Dischage Appt Complete  Medication Screening Complete  Transportation Screening Complete

## 2023-11-06 NOTE — Transfer of Care (Signed)
 Immediate Anesthesia Transfer of Care Note  Patient: Sherry Burgess  Procedure(s) Performed: FIXATION, FRACTURE, INTERTROCHANTERIC, WITH INTRAMEDULLARY ROD (Right: Hip)  Patient Location: PACU  Anesthesia Type:General  Level of Consciousness: awake  Airway & Oxygen Therapy: Patient Spontanous Breathing and Patient connected to face mask oxygen  Post-op Assessment: Report given to RN and Post -op Vital signs reviewed and stable  Post vital signs: Reviewed and stable  Last Vitals:  Vitals Value Taken Time  BP 138/73 11/06/23 11:07  Temp 98.8   Pulse 101   Resp 20 11/06/23 11:09  SpO2 100%   Vitals shown include unfiled device data.  Last Pain:  Vitals:   11/06/23 0817  TempSrc:   PainSc: 0-No pain         Complications: No notable events documented.

## 2023-11-06 NOTE — Progress Notes (Signed)
 PROGRESS NOTE    Sherry Burgess  FMW:[id number] DOB: 1950/10/27 DOA: 11/05/2023 PCP: Luke Agent, MD (Inactive)   Brief Narrative:  73 y.o. female with medical history significant of hypertension, hyperlipidemia, diabetes mellitus type II and asthma presented with a fall followed by.  Of slurred speech.  On presentation, Code stroke was called and patient was evaluated by teleneurology. CT of the head was negative for acute intracranial abnormality. Chest x-ray negative for acute active disease. X-ray of right hip showed right femoral neck fracture. CT of cervical spine showed no acute cervical spine fracture. CTA of head and neck showed no evidence of LVO. Teleneurology recommended MRI of the brain. Patient's neurological symptoms were improving in the ED. Orthopedics was consulted.  Assessment & Plan:   Right femoral neck fracture following a probable mechanical fall - Orthopedic/Dr. Onesimo planning for possible surgical intervention today. - Fall precautions.  Pain management.  N.p.o. Will need PT/OT eval after surgery.   Slurred speech - Happened after the fall.  Code stroke activated on presentation.  CT head and CTA of the head and neck negative.  Teleneurology recommending MRI of brain.  Symptoms have much improved.  MRI brain negative for acute stroke.  - No further workup needed.   Hypertension --monitor blood pressure.  Currently stable.  Antihypertensives on hold.    Hypokalemia - Resolved   Diabetes mellitus type 2 - Hold oral regimen.  Check A1c.  CBGs with SSI   Elevated LFTs - Mild.  Chronic.  Questionable cause.  Improved from prior.  Outpatient follow-up with GI  Hyperlipidemia - Resume gemfibrozil once able to take orally.  Intolerant to statins.       DVT prophylaxis: SCDs Code Status: Full  Family Communication: Husband and daughter at bedside Disposition Plan: Status is: Inpatient Remains inpatient appropriate because: Of severity of  illness    Consultants: Teleneurology/orthopedics  Procedures: None  Antimicrobials: None   Subjective: Patient seen and examined at bedside.  Complains of right hip pain.  No fever, vomiting, abdominal pain reported.  Objective: Vitals:   11/05/23 1900 11/05/23 1956 11/05/23 2302 11/06/23 0400  BP: 120/80 93/66 119/62 114/66  Pulse: 76 75 82 82  Resp: 20 20 16 16   Temp: 97.7 F (36.5 C) 98.4 F (36.9 C)  98.1 F (36.7 C)  TempSrc: Oral Oral  Oral  SpO2: 95% 100% 95% 98%  Weight:      Height:        Intake/Output Summary (Last 24 hours) at 11/06/2023 0733 Last data filed at 11/06/2023 0500 Gross per 24 hour  Intake --  Output 1000 ml  Net -1000 ml   Filed Weights   11/05/23 1300  Weight: 88.7 kg    Examination:  General exam: Appears calm and comfortable.  Elderly female lying in bed. Respiratory system: Bilateral decreased breath sounds at bases, no wheezing Cardiovascular system: S1 & S2 heard, Rate controlled currently Gastrointestinal system: Abdomen is nondistended, soft and nontender. Normal bowel sounds heard. Extremities: No cyanosis, clubbing, edema  Central nervous system: Alert and oriented. No focal neurological deficits. Moving extremities Skin: No rashes, lesions or ulcers Psychiatry: Mostly flat affect.  Not agitated.    Data Reviewed: I have personally reviewed following labs and imaging studies  CBC: Recent Labs  Lab 11/05/23 1333 11/05/23 1335 11/06/23 0359  WBC 6.2  --  6.6  NEUTROABS 2.3  --   --   HGB 14.1 14.3 12.2  HCT 42.3 42.0 38.3  MCV 87.8  --  89.7  PLT 194  --  181   Basic Metabolic Panel: Recent Labs  Lab 11/05/23 1333 11/05/23 1335 11/06/23 0359  NA 139 140 137  K 3.0* 3.0* 4.0  CL 101 102 103  CO2 23  --  23  GLUCOSE 122* 123* 160*  BUN 21 21 16   CREATININE 0.71 0.80 0.73  CALCIUM 9.6  --  8.6*   GFR: Estimated Creatinine Clearance: 68.5 mL/min (by C-G formula based on SCr of 0.73 mg/dL). Liver  Function Tests: Recent Labs  Lab 11/05/23 1333 11/06/23 0359  AST 98* 61*  ALT 105* 74*  ALKPHOS 47 39  BILITOT 1.1 1.0  PROT 7.8 6.4*  ALBUMIN 4.6 3.7   No results for input(s): LIPASE, AMYLASE in the last 168 hours. No results for input(s): AMMONIA in the last 168 hours. Coagulation Profile: Recent Labs  Lab 11/05/23 1333 11/06/23 0359  INR 1.0 1.0   Cardiac Enzymes: No results for input(s): CKTOTAL, CKMB, CKMBINDEX, TROPONINI in the last 168 hours. BNP (last 3 results) No results for input(s): PROBNP in the last 8760 hours. HbA1C: No results for input(s): HGBA1C in the last 72 hours. CBG: Recent Labs  Lab 11/05/23 1330 11/05/23 1643 11/05/23 2300 11/06/23 0410 11/06/23 0708  GLUCAP 127* 115* 167* 186* 95   Lipid Profile: Recent Labs    11/06/23 0400  CHOL 223*  HDL 25*  LDLCALC UNABLE TO CALCULATE IF TRIGLYCERIDE OVER 400 mg/dL  TRIG 554*  CHOLHDL 8.9   Thyroid  Function Tests: No results for input(s): TSH, T4TOTAL, FREET4, T3FREE, THYROIDAB in the last 72 hours. Anemia Panel: No results for input(s): VITAMINB12, FOLATE, FERRITIN, TIBC, IRON, RETICCTPCT in the last 72 hours. Sepsis Labs: No results for input(s): PROCALCITON, LATICACIDVEN in the last 168 hours.  No results found for this or any previous visit (from the past 240 hours).       Radiology Studies: MR BRAIN WO CONTRAST Result Date: 11/05/2023 EXAM: MRI BRAIN WITHOUT CONTRAST 11/05/2023 05:46:16 PM TECHNIQUE: Multiplanar multisequence MRI of the head/brain was performed without the administration of intravenous contrast. COMPARISON: CT head and CTA head and neck earlier same day. CLINICAL HISTORY: Mental status change, unknown cause. FINDINGS: BRAIN AND VENTRICLES: No acute infarct. No intracranial hemorrhage. No mass. No midline shift. No hydrocephalus. The sella is unremarkable. Normal flow voids. Scattered T2/FLAIR hyperintensity in the  periventricular and subcortical white matter, compatible with mild-to-moderate chronic microvascular ischemic changes. ORBITS: No acute abnormality. SINUSES AND MASTOIDS: No acute abnormality. BONES AND SOFT TISSUES: Normal marrow signal. No acute soft tissue abnormality. IMPRESSION: 1. No acute intracranial abnormality. 2. Mild-to-moderate chronic microvascular ischemic changes. Electronically signed by: Donnice Mania MD 11/05/2023 07:29 PM EDT RP Workstation: [id number]   CT ANGIO HEAD NECK W WO CM (CODE STROKE) Result Date: 11/05/2023 CLINICAL DATA:  Neuro deficit, acute, stroke suspected. Slurred speech and right arm weakness. EXAM: CT ANGIOGRAPHY HEAD AND NECK WITH AND WITHOUT CONTRAST TECHNIQUE: Multidetector CT imaging of the head and neck was performed using the standard protocol during bolus administration of intravenous contrast. Multiplanar CT image reconstructions and MIPs were obtained to evaluate the vascular anatomy. Carotid stenosis measurements (when applicable) are obtained utilizing NASCET criteria, using the distal internal carotid diameter as the denominator. RADIATION DOSE REDUCTION: This exam was performed according to the departmental dose-optimization program which includes automated exposure control, adjustment of the mA and/or kV according to patient size and/or use of iterative reconstruction technique. CONTRAST:  75mL OMNIPAQUE  IOHEXOL  350 MG/ML SOLN COMPARISON:  None Available.  FINDINGS: CTA NECK FINDINGS Aortic arch: Normal variant aortic arch branching pattern with common origin of the brachiocephalic and left common carotid arteries. Mild atherosclerosis without a significant stenosis of the arch vessel origins. Right carotid system: Patent with mixed plaque at the carotid bifurcation. No evidence of a significant stenosis or dissection. Left carotid system: Patent with minimal mixed plaque at the carotid bifurcation. No evidence of a significant stenosis or dissection. Vertebral  arteries: Patent and codominant. Mild plaque without evidence of a significant stenosis or dissection. Skeleton: No acute osseous abnormality or suspicious lesion. Cervical spine reported separately. Other neck: No evidence of cervical lymphadenopathy or mass. Upper chest: Clear lung apices. Review of the MIP images confirms the above findings CTA HEAD FINDINGS Anterior circulation: The intracranial internal carotid arteries are patent with extensive atherosclerosis resulting in multiple stenoses bilaterally including severe stenoses of the right last surround, bilateral cavernous, and left ophthalmic segments. ACAs and MCAs are patent without evidence of a proximal branch occlusion or significant proximal stenosis. The left A1 segment is dominant. Posterior circulation: The intracranial vertebral arteries are patent to the basilar with asymmetric left-sided atherosclerosis resulting in severe left distal V4 stenosis. There is at least mild stenosis of the distal right V4 segment at the vertebrobasilar junction. The basilar artery is patent and irregular with no more than mild multifocal narrowing. Posterior communicating arteries are diminutive or absent. Both PCAs are patent without evidence of a significant proximal stenosis. No aneurysm is identified. Venous sinuses: Patent. Anatomic variants: None of significance. Review of the MIP images confirms the above findings The finding of no large vessel occlusion was communicated to Dr. Germaine at 2:51 pm on 11/05/2023 by text page via the [facility] messaging system. IMPRESSION: 1. No large vessel occlusion. 2. Intracranial atherosclerosis including severe bilateral ICA and left V4 stenoses. 3. Widely patent cervical carotid arteries. 4.  Aortic Atherosclerosis (ICD10-I70.0). Electronically Signed   By: Dasie Hamburg M.D.   On: 11/05/2023 15:01   DG Hip Unilat W or Wo Pelvis 2-3 Views Right Result Date: 11/05/2023 CLINICAL DATA:  Fall.  Right hip pain. EXAM: DG HIP  (WITH OR WITHOUT PELVIS) 2-3V RIGHT COMPARISON:  None Available. FINDINGS: Minimally displaced fracture of the right femoral neck. No dislocation. The bones are osteopenic. Degenerative changes of the lower lumbar spine. The soft tissues are unremarkable. IMPRESSION: Fracture of the right femoral neck. Electronically Signed   By: Vanetta Chou M.D.   On: 11/05/2023 14:23   DG Chest 1 View Result Date: 11/05/2023 CLINICAL DATA:  Fall, slurred speech. EXAM: CHEST  1 VIEW COMPARISON:  February 12, 2014. FINDINGS: The heart size and mediastinal contours are within normal limits. Both lungs are clear. The visualized skeletal structures are unremarkable. IMPRESSION: No active disease. Electronically Signed   By: Lynwood Landy Raddle M.D.   On: 11/05/2023 14:22   CT Cervical Spine Wo Contrast Result Date: 11/05/2023 CLINICAL DATA:  Neck trauma (Age >= 65y).  Fall. EXAM: CT CERVICAL SPINE WITHOUT CONTRAST TECHNIQUE: Multidetector CT imaging of the cervical spine was performed without intravenous contrast. Multiplanar CT image reconstructions were also generated. RADIATION DOSE REDUCTION: This exam was performed according to the departmental dose-optimization program which includes automated exposure control, adjustment of the mA and/or kV according to patient size and/or use of iterative reconstruction technique. COMPARISON:  None Available. FINDINGS: Alignment: Cervical spine straightening.  No significant listhesis. Skull base and vertebrae: No acute fracture or suspicious lesion. Soft tissues and spinal canal: No prevertebral fluid or  swelling. No visible canal hematoma. Disc levels: Moderate cervical disc degeneration, greatest from C4-5 through C6-7. Predominantly upper cervical facet arthrosis, moderately severe on the left at C2-3. No evidence of high-grade spinal canal stenosis. Upper chest: Clear lung apices. Other: None. IMPRESSION: 1. No acute cervical spine fracture. 2. Moderate cervical disc and facet  degeneration. Electronically Signed   By: Dasie Hamburg M.D.   On: 11/05/2023 14:03   CT HEAD CODE STROKE WO CONTRAST Result Date: 11/05/2023 CLINICAL DATA:  Code stroke. Neuro deficit, acute, stroke suspected. Slurred speech and weakness. EXAM: CT HEAD WITHOUT CONTRAST TECHNIQUE: Contiguous axial images were obtained from the base of the skull through the vertex without intravenous contrast. RADIATION DOSE REDUCTION: This exam was performed according to the departmental dose-optimization program which includes automated exposure control, adjustment of the mA and/or kV according to patient size and/or use of iterative reconstruction technique. COMPARISON:  None available. FINDINGS: Brain: There is no evidence of an acute infarct, intracranial hemorrhage, mass, midline shift, or extra-axial fluid collection. Cerebral volume is normal. The ventricles are normal in size. Patchy a confluent hypodensities in the cerebral white matter are nonspecific but compatible with moderate chronic small vessel ischemic disease. Vascular: Calcified atherosclerosis at the skull base. No hyperdense vessel. Skull: No fracture or suspicious lesion. Sinuses/Orbits: Visualized paranasal sinuses and mastoid air cells are clear. Unremarkable orbits. Other: None. ASPECTS (Alberta Stroke Program Early CT Score) - Ganglionic level infarction (caudate, lentiform nuclei, internal capsule, insula, M1-M3 cortex): 7 - Supraganglionic infarction (M4-M6 cortex): 3 Total score (0-10 with 10 being normal): 10 These results were communicated to Dr. Germaine at 1:59 pm on 11/05/2023 by text page via the [facility] messaging system. IMPRESSION: 1. No evidence of acute intracranial abnormality. ASPECTS of 10. 2. Moderate chronic small vessel ischemic disease. Electronically Signed   By: Dasie Hamburg M.D.   On: 11/05/2023 14:00        Scheduled Meds:   stroke: early stages of recovery book   Does not apply Once   insulin  aspart  0-20 Units Subcutaneous  Q4H   mupirocin  ointment  1 Application Nasal BID   Continuous Infusions:  0.9 % NaCl with KCl 40 mEq / L 75 mL/hr at 11/05/23 1818    ceFAZolin  (ANCEF ) IV     tranexamic acid             Sophie Mao, MD [facility] 11/06/2023, 7:33 AM

## 2023-11-06 NOTE — Anesthesia Preprocedure Evaluation (Signed)
 Anesthesia Evaluation  Patient identified by MRN, date of birth, ID band Patient awake    Reviewed: Allergy & Precautions, H&P , NPO status , Patient's Chart, lab work & pertinent test results, reviewed documented beta blocker date and time   Airway Mallampati: II  TM Distance: >3 FB Neck ROM: full    Dental no notable dental hx.    Pulmonary asthma , former smoker   Pulmonary exam normal breath sounds clear to auscultation       Cardiovascular Exercise Tolerance: Good hypertension,  Rhythm:regular Rate:Normal     Neuro/Psych negative neurological ROS  negative psych ROS   GI/Hepatic Neg liver ROS,GERD  ,,  Endo/Other  diabetes    Renal/GU negative Renal ROS  negative genitourinary   Musculoskeletal   Abdominal   Peds  Hematology negative hematology ROS (+)   Anesthesia Other Findings   Reproductive/Obstetrics negative OB ROS                              Anesthesia Physical Anesthesia Plan  ASA: 2  Anesthesia Plan: General   Post-op Pain Management:    Induction:   PONV Risk Score and Plan: Propofol  infusion  Airway Management Planned:   Additional Equipment:   Intra-op Plan:   Post-operative Plan:   Informed Consent: I have reviewed the patients History and Physical, chart, labs and discussed the procedure including the risks, benefits and alternatives for the proposed anesthesia with the patient or authorized representative who has indicated his/her understanding and acceptance.     Dental Advisory Given  Plan Discussed with: CRNA  Anesthesia Plan Comments:         Anesthesia Quick Evaluation

## 2023-11-06 NOTE — Plan of Care (Signed)
  Problem: Education: Goal: Ability to describe self-care measures that may prevent or decrease complications (Diabetes Survival Skills Education) will improve Outcome: Progressing   Problem: Coping: Goal: Ability to adjust to condition or change in health will improve Outcome: Progressing   Problem: Fluid Volume: Goal: Ability to maintain a balanced intake and output will improve Outcome: Progressing   Problem: Metabolic: Goal: Ability to maintain appropriate glucose levels will improve Outcome: Progressing   Problem: Tissue Perfusion: Goal: Adequacy of tissue perfusion will improve Outcome: Progressing   Problem: Ischemic Stroke/TIA Tissue Perfusion: Goal: Complications of ischemic stroke/TIA will be minimized Outcome: Progressing   Problem: Coping: Goal: Will verbalize positive feelings about self Outcome: Progressing Goal: Will identify appropriate support needs Outcome: Progressing   Problem: Health Behavior/Discharge Planning: Goal: Ability to manage health-related needs will improve Outcome: Progressing Goal: Goals will be collaboratively established with patient/family Outcome: Progressing   Problem: Self-Care: Goal: Ability to participate in self-care as condition permits will improve Outcome: Progressing Goal: Verbalization of feelings and concerns over difficulty with self-care will improve Outcome: Progressing Goal: Ability to communicate needs accurately will improve Outcome: Progressing   Problem: Nutrition: Goal: Risk of aspiration will decrease Outcome: Progressing   Problem: Education: Goal: Knowledge of General Education information will improve Description: Including pain rating scale, medication(s)/side effects and non-pharmacologic comfort measures Outcome: Progressing   Problem: Health Behavior/Discharge Planning: Goal: Ability to manage health-related needs will improve Outcome: Progressing   Problem: Clinical Measurements: Goal:  Ability to maintain clinical measurements within normal limits will improve Outcome: Progressing Goal: Will remain free from infection Outcome: Progressing Goal: Diagnostic test results will improve Outcome: Progressing Goal: Respiratory complications will improve Outcome: Progressing Goal: Cardiovascular complication will be avoided Outcome: Progressing   Problem: Coping: Goal: Level of anxiety will decrease Outcome: Progressing   Problem: Pain Managment: Goal: General experience of comfort will improve and/or be controlled Outcome: Progressing   Problem: Nutritional: Goal: Maintenance of adequate nutrition will improve Outcome: Not Progressing   Problem: Activity: Goal: Risk for activity intolerance will decrease Outcome: Not Progressing   Problem: Nutrition: Goal: Adequate nutrition will be maintained Outcome: Not Progressing

## 2023-11-06 NOTE — Op Note (Signed)
 Orthopaedic Surgery Operative Note (CSN: [id number])  Sherry Burgess  [birth year] Date of Surgery: 11/05/2023 - 11/06/2023   Diagnoses:  Right basicervical femoral neck fracture  Procedure: Cephalomedullary nail for Right basicervical femoral neck fracture   Operative Finding Successful completion of the planned procedure.  Placement of 35 cm x 10 mm x 125 degree cephalomedullary nail   Post-Op Diagnosis: Same Surgeons:Primary: Onesimo Oneil LABOR, MD Assistants:  none Location: [facility] OR ROOM 4 Anesthesia: General with local anesthesia Antibiotics: Ancef  2 g Tourniquet time: N/A Estimated Blood Loss: 200 cc Complications: None Specimens: None  Implants: Implant Name Type Inv. Item Serial No. Manufacturer Lot No. LRB No. Used Action  NAIL IM TROCH 10X36 125D RT - [id number] Nail NAIL IM TROCH 10X36 125D RT  [employer] [id number] Right 1 Implanted  TELESCOPING LAG SCREW, 10.5 X Screw   [employer] [id number] Right 1 Implanted  SCREW CORT CAPT 5.0X40 - [id number] Screw SCREW CORT CAPT 5.0X40  [employer] [id number] Right 1 Implanted    Indications for Surgery:   Sherry Burgess is a 73 y.o. female who had a mechanical fall and sustained a Right basicervical femoral neck fracture.  I recommended operative fixation to restore stability and allow the patient to ambulate immediately postop.  Benefits and risks of operative and nonoperative management were discussed prior to surgery with the patient and informed consent form was completed.  Specific risks including infection, need for additional surgery, persistent pain, bleeding, malunion, nonunion and more severe complications associated with anesthesia.  The patient/guardian elected to proceed and surgical consent was obtained.    Procedure:   The patient was identified properly. Informed consent was obtained and the surgical site was marked. The patient was taken to the OR where general anesthesia was induced.    The patient was placed  supine on a fracture table and appropriate reduction was obtained and visualized on fluoroscopy prior to the beginning of the procedure.  Timeout was performed before the beginning of the case.  Ancef  2 g dosing was confirmed prior to making incision.  The patient received TXA prior to the start of surgery.   We made an incision proximal to the greater trochanter and dissected down through the fascia.  We then carefully placed a guidepin, localizing under fluoroscopy.  Once satisfied with the starting point, the entry reamer was used to gain entry into the intramedullary canal.  A ball tip guidewire was then introduced and passed to an appropriate level at the physeal scar of the distal femur and measurement was obtained proximally using fluoroscopy.  We selected the appropriate length of nail, as noted above.  Entry reamer was used and based on preoperative templating, we reamed up to 11.5 mm in order to place a 10 mm nail.  At this point we placed our nail localizing under fluoroscopy, and confirmed that it was at the appropriate level.  Next we used the outrigger device to pass a wire into the femoral neck, and then the cephalomedullary lag screw.  The screw was locked proximally to avoid over collapse.  We then turned our attention to the distal interlocking screw.  Once again, we used the outrigger device to place a single interlocking screw in the midshaft area.  The outrigger device was removed and final fluoroscopic images were obtained.  The wounds were thoroughly irrigated closed in a multilayer fashion with 2-0 monocryl and 4-0 monocryl.  Sterile dressings were placed.  The patient was awoken from general anesthesia and  taken to the PACU in stable condition without complication.     Post-operative plan:  Weightbearing: The patient will be WBAT on the operative extremity.   DVT prophylaxis per primary team, no orthopedic contraindications.  Recommend 81 mg Aspirin  BID, unless patient cannot  tolerate or was previously on anticoagulation.  Prefer to start Ppx POD#1 Pain control with PRN pain medication preferring oral medicines.   Dressing can be reinforced as needed, will change on POD#2/3 if needed.  Patient does not need to remain hospitalized for dressing change Follow up plan: approximately 2 weeks postop for incision check and XR.  If the patient will be returning to a nursing facility, sutures can be trimmed around this time and a follow up appointment can be scheduled for 6 weeks after surgery. XR at next visit:  please obtain [facility] pelvis, and 2 views of the Right femur

## 2023-11-07 ENCOUNTER — Encounter (HOSPITAL_COMMUNITY): Payer: Self-pay | Admitting: Orthopedic Surgery

## 2023-11-07 DIAGNOSIS — S72001A Fracture of unspecified part of neck of right femur, initial encounter for closed fracture: Secondary | ICD-10-CM | POA: Diagnosis not present

## 2023-11-07 LAB — MISC LABCORP TEST (SEND OUT): Labcorp test code: 120295

## 2023-11-07 LAB — GLUCOSE, CAPILLARY
Glucose-Capillary: 180 mg/dL — ABNORMAL HIGH (ref 70–99)
Glucose-Capillary: 153 mg/dL — ABNORMAL HIGH (ref 70–99)
Glucose-Capillary: 193 mg/dL — ABNORMAL HIGH (ref 70–99)
Glucose-Capillary: 168 mg/dL — ABNORMAL HIGH (ref 70–99)
Glucose-Capillary: 130 mg/dL — ABNORMAL HIGH (ref 70–99)
Glucose-Capillary: 139 mg/dL — ABNORMAL HIGH (ref 70–99)

## 2023-11-07 LAB — CBC WITH DIFFERENTIAL/PLATELET
Abs Immature Granulocytes: 0.02 K/uL (ref 0.00–0.07)
Basophils Absolute: 0 K/uL (ref 0.0–0.1)
Basophils Relative: 0 %
Eosinophils Absolute: 0 K/uL (ref 0.0–0.5)
Eosinophils Relative: 0 %
HCT: 36.1 % (ref 36.0–46.0)
Hemoglobin: 11.4 g/dL — ABNORMAL LOW (ref 12.0–15.0)
Immature Granulocytes: 0 %
Lymphocytes Relative: 15 %
Lymphs Abs: 1.3 K/uL (ref 0.7–4.0)
MCH: 29.2 pg (ref 26.0–34.0)
MCHC: 31.6 g/dL (ref 30.0–36.0)
MCV: 92.6 fL (ref 80.0–100.0)
Monocytes Absolute: 0.7 K/uL (ref 0.1–1.0)
Monocytes Relative: 9 %
Neutro Abs: 6.3 K/uL (ref 1.7–7.7)
Neutrophils Relative %: 76 %
Platelets: 151 K/uL (ref 150–400)
RBC: 3.9 MIL/uL (ref 3.87–5.11)
RDW: 13.7 % (ref 11.5–15.5)
WBC: 8.3 K/uL (ref 4.0–10.5)
nRBC: 0 % (ref 0.0–0.2)

## 2023-11-07 LAB — MAGNESIUM: Magnesium: 1.9 mg/dL (ref 1.7–2.4)

## 2023-11-07 LAB — BASIC METABOLIC PANEL WITH GFR
Anion gap: 9 (ref 5–15)
BUN: 12 mg/dL (ref 8–23)
CO2: 27 mmol/L (ref 22–32)
Calcium: 8.6 mg/dL — ABNORMAL LOW (ref 8.9–10.3)
Chloride: 102 mmol/L (ref 98–111)
Creatinine, Ser: 0.76 mg/dL (ref 0.44–1.00)
GFR, Estimated: >60 mL/min (ref >60)
Glucose, Bld: 166 mg/dL — ABNORMAL HIGH (ref 70–99)
Potassium: 4.4 mmol/L (ref 3.5–5.1)
Sodium: 138 mmol/L (ref 135–145)

## 2023-11-07 MED ORDER — ASPIRIN 81 MG PO CHEW
81.0000 mg | CHEWABLE_TABLET | Freq: Two times a day (BID) | ORAL | Status: DC
  Administered 2023-11-07 – 2023-11-08 (×3): 81 mg via ORAL
  Filled 2023-11-07 (×3): qty 1

## 2023-11-07 MED ORDER — POLYETHYLENE GLYCOL 3350 17 G PO PACK
17.0000 g | PACK | Freq: Every day | ORAL | Status: DC | PRN
  Administered 2023-11-08: 17 g via ORAL
  Filled 2023-11-07: qty 1

## 2023-11-07 MED ORDER — SENNOSIDES-DOCUSATE SODIUM 8.6-50 MG PO TABS
1.0000 | ORAL_TABLET | Freq: Two times a day (BID) | ORAL | Status: DC
  Administered 2023-11-07 – 2023-11-08 (×3): 1 via ORAL
  Filled 2023-11-07 (×3): qty 1

## 2023-11-07 MED ORDER — OXYCODONE HCL 5 MG PO TABS: 5.0000 mg | ORAL_TABLET | ORAL | Status: DC | PRN

## 2023-11-07 MED ORDER — BISACODYL 10 MG RE SUPP: 10.0000 mg | Freq: Every day | RECTAL | Status: DC | PRN

## 2023-11-07 NOTE — TOC Progression Note (Signed)
 Transition of Care [facility]) - Progression Note    Patient Details  Name: Sherry Burgess MRN: [id number] Date of Birth: [birth year]  Transition of Care [facility] CM/SW Contact  Hoy DELENA Bigness, LCSW Phone Number: 11/07/2023, 10:54 AM  Clinical Narrative:    Pt and family agreeable to recommendation for HHPT/OT. Pt agreeable to referral being made to [facility] Pt also recommended for RW and BSC. Pt would like DME delivered to home vs to her hospital room.  HHPT/OT arranged with [facility]. HH orders will need to be placed prior to discharge.  RW and BSC ordered through [employer] [facility] to be delivered to pt's home address.     Expected Discharge Plan: Skilled Nursing Facility Barriers to Discharge: Barriers Resolved               Expected Discharge Plan and Services In-house Referral: Clinical Social Work Discharge Planning Services: NA Post Acute Care Choice: Home Health, Durable Medical Equipment Living arrangements for the past 2 months: Single Family Home                 DME Arranged: Walker rolling, Bedside commode DME Agency: [employer] Date DME Agency Contacted: 11/07/23 Time DME Agency Contacted: 1054 Representative spoke with at DME Agency: Zack HH Arranged: PT, OT HH Agency: [facility] Date [facility] Agency Contacted: 11/07/23 Time HH Agency Contacted: 1054 Representative spoke with at [facility] Agency: Joane   Social Drivers of Health (SDOH) Interventions SDOH Screenings   Food Insecurity: No Food Insecurity (11/05/2023)  Housing: Low Risk  (11/05/2023)  Transportation Needs: No Transportation Needs (11/05/2023)  Utilities: Not At Risk (11/05/2023)  Social Connections: Moderately Integrated (11/05/2023)  Tobacco Use: Medium Risk (11/06/2023)    Readmission Risk Interventions    11/06/2023    1:13 PM  Readmission Risk Prevention Plan  Post Dischage Appt Complete  Medication Screening Complete  Transportation Screening Complete

## 2023-11-07 NOTE — Progress Notes (Signed)
   ORTHOPAEDIC PROGRESS NOTE  s/p Procedure(s): IMN for right basicervical femoral neck fracture    DOS: 11/07/2023  SUBJECTIVE: No issues over night.  Pain is controlled.  She feels better.  Hip is more stable.   OBJECTIVE: PE:  Alert and oriented  Seated in bedside chair  Lateral hip dressings are clean, dry and intact No drainage Some swelling and bruising Toes are WWP Sensation intact to the dorsum of the foot Active motion intact in the TA/EHL  Vitals:   11/07/23 1425 11/07/23 2001  BP: 126/85 (!) 145/66  Pulse: 93 88  Resp: 17 18  Temp: 97.8 F (36.6 C) 98.4 F (36.9 C)  SpO2: 97% 92%      Latest Ref Rng & Units 11/07/2023    3:07 AM 11/06/2023    3:59 AM 11/05/2023    1:35 PM  CBC  WBC 4.0 - 10.5 K/uL 8.3  6.6    Hemoglobin 12.0 - 15.0 g/dL 88.5  87.7  85.6   Hematocrit 36.0 - 46.0 % 36.1  38.3  42.0   Platelets 150 - 400 K/uL 151  181       ASSESSMENT: Sherry Burgess is a 73 y.o. female doing well postoperatively.  PLAN: Weightbearing: WBAT RLE Incisional and dressing care: Reinforce dressings as needed Orthopedic device(s): None VTE prophylaxis: Recommend 81 mg BID to start POD#1 Pain control: As needed.  Prefer oral medications.  Follow - up plan: 2 weeks   Contact information:     Sunnie Odden A. Onesimo, MD MS [facility] [address] [place],  [place]  [address] Phone: [phone] Fax: [phone]

## 2023-11-07 NOTE — Progress Notes (Signed)
 Mobility Specialist Progress Note:    11/07/23 1300  Mobility  Activity Pivoted/transferred from chair to bed  Level of Assistance Minimal assist, patient does 75% or more  Assistive Device Front wheel walker  Distance Ambulated (ft) 3 ft  Range of Motion/Exercises Active;All extremities  RLE Weight Bearing Per Provider Order WBAT  Activity Response Tolerated well  Mobility Referral Yes  Mobility visit 1 Mobility  Mobility Specialist Start Time (ACUTE ONLY) 1300  Mobility Specialist Stop Time (ACUTE ONLY) 1320  Mobility Specialist Time Calculation (min) (ACUTE ONLY) 20 min   Pt received in chair, requesting assistance to bed. Required MinA to stand and transfer with RW. Tolerated well, asx throughout. All needs met.  Sherry Burgess Mobility Specialist Please contact via Special educational needs teacher or  Rehab office at [phone]

## 2023-11-07 NOTE — Progress Notes (Signed)
 PROGRESS NOTE    Sherry Burgess  FMW:[id number] DOB: 05-28-1950 DOA: 11/05/2023 PCP: Luke Agent, MD (Inactive)   Brief Narrative:  73 y.o. female with medical history significant of hypertension, hyperlipidemia, diabetes mellitus type II and asthma presented with a fall followed by.  Of slurred speech.  On presentation, Code stroke was called and patient was evaluated by teleneurology. CT of the head was negative for acute intracranial abnormality. Chest x-ray negative for acute active disease. X-ray of right hip showed right femoral neck fracture. CT of cervical spine showed no acute cervical spine fracture. CTA of head and neck showed no evidence of LVO. Teleneurology recommended MRI of the brain. Patient's neurological symptoms were improving in the ED. Orthopedics was consulted.  MRI of brain was negative for acute stroke.  She underwent surgical intervention by orthopedics on 11/06/2023  Assessment & Plan:   Right femoral neck fracture following a probable mechanical fall - underwent surgical intervention by orthopedics on 11/06/2023.  Wound care activity as per orthopedics recommendations.  Orthopedics recommending aspirin  81 mg twice daily for DVT prophylaxis: Will start from today.   - Fall precautions.  Pain management.  PT/OT eval    Slurred speech - Happened after the fall.  Code stroke activated on presentation.  CT head and CTA of the head and neck negative.  Teleneurology recommending MRI of brain.  Symptoms have much improved.  MRI brain negative for acute stroke.  - No further workup needed.   Hypertension --monitor blood pressure.  Currently stable.  Antihypertensives on hold.    Hypokalemia - Resolved   Diabetes mellitus type 2 - Hold oral regimen.  A1c 8.1.  CBGs with SSI   Elevated LFTs - Mild.  Chronic.  Questionable cause.  Improved from prior.  Outpatient follow-up with GI  Hyperlipidemia - Resume gemfibrozil on discharge.  Intolerant to statins.       DVT  prophylaxis: SCDs Code Status: Full  Family Communication: Husband and daughter at bedside on 11/06/2023 Disposition Plan: Status is: Inpatient Remains inpatient appropriate because: Of severity of illness    Consultants: Teleneurology/orthopedics  Procedures: As above  Antimicrobials: Perioperative   Subjective: Patient seen and examined at bedside.  Has intermittent right hip pain.  Denies worsening shortness of breath, abdominal pain, fever or vomiting. Objective: Vitals:   11/06/23 1444 11/06/23 1808 11/06/23 2148 11/07/23 0311  BP: (!) 91/57 (!) 109/59 (!) 112/58 118/68  Pulse: 85 98 94 94  Resp: 20 (!) 24 16 18   Temp:  98.9 F (37.2 C) 98.1 F (36.7 C) 98.6 F (37 C)  TempSrc:  Oral    SpO2: 96% 96% 95% 93%  Weight:      Height:        Intake/Output Summary (Last 24 hours) at 11/07/2023 0755 Last data filed at 11/07/2023 0517 Gross per 24 hour  Intake 440 ml  Output 950 ml  Net -510 ml   Filed Weights   11/05/23 1300 11/06/23 0817  Weight: 88.7 kg 88.7 kg    Examination:  General: On room air.  No distress. ENT/neck: No thyromegaly.  JVD is not elevated  respiratory: Decreased breath sounds at bases bilaterally with some crackles; no wheezing  CVS: S1-S2 heard, currently rate controlled Abdominal: Soft, nontender, slightly distended; no organomegaly, bowel sounds are heard Extremities: Trace lower extremity edema; no cyanosis  CNS: Awake and alert.  No focal neurologic deficit.  Moves extremities Lymph: No obvious lymphadenopathy Skin: No obvious ecchymosis/lesions  psych: Flat affect mostly.  Currently not agitated. musculoskeletal: No obvious joint swelling/deformity     Data Reviewed: I have personally reviewed following labs and imaging studies  CBC: Recent Labs  Lab 11/05/23 1333 11/05/23 1335 11/06/23 0359 11/07/23 0307  WBC 6.2  --  6.6 8.3  NEUTROABS 2.3  --   --  6.3  HGB 14.1 14.3 12.2 11.4*  HCT 42.3 42.0 38.3 36.1  MCV 87.8   --  89.7 92.6  PLT 194  --  181 151   Basic Metabolic Panel: Recent Labs  Lab 11/05/23 1333 11/05/23 1335 11/06/23 0359 11/07/23 0307  NA 139 140 137 138  K 3.0* 3.0* 4.0 4.4  CL 101 102 103 102  CO2 23  --  23 27  GLUCOSE 122* 123* 160* 166*  BUN 21 21 16 12   CREATININE 0.71 0.80 0.73 0.76  CALCIUM 9.6  --  8.6* 8.6*  MG  --   --   --  1.9   GFR: Estimated Creatinine Clearance: 68.5 mL/min (by C-G formula based on SCr of 0.76 mg/dL). Liver Function Tests: Recent Labs  Lab 11/05/23 1333 11/06/23 0359  AST 98* 61*  ALT 105* 74*  ALKPHOS 47 39  BILITOT 1.1 1.0  PROT 7.8 6.4*  ALBUMIN 4.6 3.7   No results for input(s): LIPASE, AMYLASE in the last 168 hours. No results for input(s): AMMONIA in the last 168 hours. Coagulation Profile: Recent Labs  Lab 11/05/23 1333 11/06/23 0359  INR 1.0 1.0   Cardiac Enzymes: No results for input(s): CKTOTAL, CKMB, CKMBINDEX, TROPONINI in the last 168 hours. BNP (last 3 results) No results for input(s): PROBNP in the last 8760 hours. HbA1C: Recent Labs    11/05/23 1333  HGBA1C 8.1*   CBG: Recent Labs  Lab 11/06/23 1608 11/06/23 2033 11/06/23 2348 11/07/23 0307 11/07/23 0724  GLUCAP 125* 184* 149* 180* 153*   Lipid Profile: Recent Labs    11/06/23 0400  CHOL 223*  HDL 25*  LDLCALC UNABLE TO CALCULATE IF TRIGLYCERIDE OVER 400 mg/dL  TRIG 554*  CHOLHDL 8.9   Thyroid  Function Tests: No results for input(s): TSH, T4TOTAL, FREET4, T3FREE, THYROIDAB in the last 72 hours. Anemia Panel: No results for input(s): VITAMINB12, FOLATE, FERRITIN, TIBC, IRON, RETICCTPCT in the last 72 hours. Sepsis Labs: No results for input(s): PROCALCITON, LATICACIDVEN in the last 168 hours.  Recent Results (from the past 240 hours)  Surgical PCR screen     Status: None   Collection Time: 11/06/23  4:15 AM   Specimen: Nasal Mucosa; Nasal Swab  Result Value Ref Range Status   MRSA, PCR  NEGATIVE NEGATIVE Final   Staphylococcus aureus NEGATIVE NEGATIVE Final    Comment: (NOTE) The Xpert SA Assay (FDA approved for NASAL specimens in patients 57 years of age and older), is one component of a comprehensive surveillance program. It is not intended to diagnose infection nor to guide or monitor treatment. Performed at [facility], [address] [place], [place] [address]          Radiology Studies: DG HIP UNILAT WITH PELVIS 2-3 VIEWS RIGHT Result Date: 11/06/2023 CLINICAL DATA:  [id number] Femoral neck fracture (HCC) 95717; [id number] Pain 144615. EXAM: RIGHT FEMUR PORTABLE 2 VIEW; DG HIP (WITH OR WITHOUT PELVIS) 2-3V RIGHT COMPARISON:  None Available. FINDINGS: Status post open reduction and internal fixation of intertrochanteric proximal right femur fracture with trochanteric fixation nail and fixation screws. No periprosthetic fracture or lucency. There is near anatomic alignment. No other acute fracture or dislocation. No  aggressive osseous lesion. Unremarkable symphysis pubis. There are mild degenerative changes of bilateral hip joints. No radiopaque foreign bodies. IMPRESSION: *Status post open reduction and internal fixation of intertrochanteric proximal right femur fracture with trochanteric fixation nail and fixation screws. No periprosthetic fracture or lucency. There is near anatomic alignment. No other acute fracture or dislocation. Electronically Signed   By: Ree Molt M.D.   On: 11/06/2023 11:49   DG FEMUR PORT, MIN 2 VIEWS RIGHT Result Date: 11/06/2023 CLINICAL DATA:  [id number] Femoral neck fracture (HCC) 95717; [id number] Pain 855384. EXAM: RIGHT FEMUR PORTABLE 2 VIEW; DG HIP (WITH OR WITHOUT PELVIS) 2-3V RIGHT COMPARISON:  None Available. FINDINGS: Status post open reduction and internal fixation of intertrochanteric proximal right femur fracture with trochanteric fixation nail and fixation screws. No periprosthetic fracture or lucency. There is near anatomic alignment. No other  acute fracture or dislocation. No aggressive osseous lesion. Unremarkable symphysis pubis. There are mild degenerative changes of bilateral hip joints. No radiopaque foreign bodies. IMPRESSION: *Status post open reduction and internal fixation of intertrochanteric proximal right femur fracture with trochanteric fixation nail and fixation screws. No periprosthetic fracture or lucency. There is near anatomic alignment. No other acute fracture or dislocation. Electronically Signed   By: Ree Molt M.D.   On: 11/06/2023 11:49   DG Pelvis Portable Result Date: 11/06/2023 CLINICAL DATA:  [id number] Femoral neck fracture (HCC) 95717. EXAM: PORTABLE PELVIS 1-2 VIEWS COMPARISON:  11/05/2023 FINDINGS: Status post open reduction and internal fixation of intertrochanteric proximal right femur fracture with trochanteric fixation nail and helical blade. No periprosthetic fracture or lucency. There is near anatomic alignment. No other acute fracture or dislocation. No aggressive osseous lesion. Visualized sacral arcuate lines are unremarkable. Unremarkable symphysis pubis. There are mild degenerative changes of bilateral hip joints characterized by mild joint space narrowing and osteophytosis of the superior acetabulum. No radiopaque foreign bodies. IMPRESSION: *Status post open reduction and internal fixation of intertrochanteric proximal right femur fracture. No periprosthetic fracture or lucency. Electronically Signed   By: Ree Molt M.D.   On: 11/06/2023 11:45   DG C-Arm 1-60 Min-No Report Result Date: 11/06/2023 Fluoroscopy was utilized by the requesting physician.  No radiographic interpretation.   MR BRAIN WO CONTRAST Result Date: 11/05/2023 EXAM: MRI BRAIN WITHOUT CONTRAST 11/05/2023 05:46:16 PM TECHNIQUE: Multiplanar multisequence MRI of the head/brain was performed without the administration of intravenous contrast. COMPARISON: CT head and CTA head and neck earlier same day. CLINICAL HISTORY: Mental status  change, unknown cause. FINDINGS: BRAIN AND VENTRICLES: No acute infarct. No intracranial hemorrhage. No mass. No midline shift. No hydrocephalus. The sella is unremarkable. Normal flow voids. Scattered T2/FLAIR hyperintensity in the periventricular and subcortical white matter, compatible with mild-to-moderate chronic microvascular ischemic changes. ORBITS: No acute abnormality. SINUSES AND MASTOIDS: No acute abnormality. BONES AND SOFT TISSUES: Normal marrow signal. No acute soft tissue abnormality. IMPRESSION: 1. No acute intracranial abnormality. 2. Mild-to-moderate chronic microvascular ischemic changes. Electronically signed by: Donnice Mania MD 11/05/2023 07:29 PM EDT RP Workstation: [id number]   CT ANGIO HEAD NECK W WO CM (CODE STROKE) Result Date: 11/05/2023 CLINICAL DATA:  Neuro deficit, acute, stroke suspected. Slurred speech and right arm weakness. EXAM: CT ANGIOGRAPHY HEAD AND NECK WITH AND WITHOUT CONTRAST TECHNIQUE: Multidetector CT imaging of the head and neck was performed using the standard protocol during bolus administration of intravenous contrast. Multiplanar CT image reconstructions and MIPs were obtained to evaluate the vascular anatomy. Carotid stenosis measurements (when applicable) are obtained utilizing NASCET criteria, using the distal internal  carotid diameter as the denominator. RADIATION DOSE REDUCTION: This exam was performed according to the departmental dose-optimization program which includes automated exposure control, adjustment of the mA and/or kV according to patient size and/or use of iterative reconstruction technique. CONTRAST:  75mL OMNIPAQUE  IOHEXOL  350 MG/ML SOLN COMPARISON:  None Available. FINDINGS: CTA NECK FINDINGS Aortic arch: Normal variant aortic arch branching pattern with common origin of the brachiocephalic and left common carotid arteries. Mild atherosclerosis without a significant stenosis of the arch vessel origins. Right carotid system: Patent with mixed  plaque at the carotid bifurcation. No evidence of a significant stenosis or dissection. Left carotid system: Patent with minimal mixed plaque at the carotid bifurcation. No evidence of a significant stenosis or dissection. Vertebral arteries: Patent and codominant. Mild plaque without evidence of a significant stenosis or dissection. Skeleton: No acute osseous abnormality or suspicious lesion. Cervical spine reported separately. Other neck: No evidence of cervical lymphadenopathy or mass. Upper chest: Clear lung apices. Review of the MIP images confirms the above findings CTA HEAD FINDINGS Anterior circulation: The intracranial internal carotid arteries are patent with extensive atherosclerosis resulting in multiple stenoses bilaterally including severe stenoses of the right last surround, bilateral cavernous, and left ophthalmic segments. ACAs and MCAs are patent without evidence of a proximal branch occlusion or significant proximal stenosis. The left A1 segment is dominant. Posterior circulation: The intracranial vertebral arteries are patent to the basilar with asymmetric left-sided atherosclerosis resulting in severe left distal V4 stenosis. There is at least mild stenosis of the distal right V4 segment at the vertebrobasilar junction. The basilar artery is patent and irregular with no more than mild multifocal narrowing. Posterior communicating arteries are diminutive or absent. Both PCAs are patent without evidence of a significant proximal stenosis. No aneurysm is identified. Venous sinuses: Patent. Anatomic variants: None of significance. Review of the MIP images confirms the above findings The finding of no large vessel occlusion was communicated to Dr. Germaine at 2:51 pm on 11/05/2023 by text page via the [facility] messaging system. IMPRESSION: 1. No large vessel occlusion. 2. Intracranial atherosclerosis including severe bilateral ICA and left V4 stenoses. 3. Widely patent cervical carotid arteries. 4.   Aortic Atherosclerosis (ICD10-I70.0). Electronically Signed   By: Dasie Hamburg M.D.   On: 11/05/2023 15:01   DG Hip Unilat W or Wo Pelvis 2-3 Views Right Result Date: 11/05/2023 CLINICAL DATA:  Fall.  Right hip pain. EXAM: DG HIP (WITH OR WITHOUT PELVIS) 2-3V RIGHT COMPARISON:  None Available. FINDINGS: Minimally displaced fracture of the right femoral neck. No dislocation. The bones are osteopenic. Degenerative changes of the lower lumbar spine. The soft tissues are unremarkable. IMPRESSION: Fracture of the right femoral neck. Electronically Signed   By: Vanetta Chou M.D.   On: 11/05/2023 14:23   DG Chest 1 View Result Date: 11/05/2023 CLINICAL DATA:  Fall, slurred speech. EXAM: CHEST  1 VIEW COMPARISON:  February 12, 2014. FINDINGS: The heart size and mediastinal contours are within normal limits. Both lungs are clear. The visualized skeletal structures are unremarkable. IMPRESSION: No active disease. Electronically Signed   By: Lynwood Landy Raddle M.D.   On: 11/05/2023 14:22   CT Cervical Spine Wo Contrast Result Date: 11/05/2023 CLINICAL DATA:  Neck trauma (Age >= 65y).  Fall. EXAM: CT CERVICAL SPINE WITHOUT CONTRAST TECHNIQUE: Multidetector CT imaging of the cervical spine was performed without intravenous contrast. Multiplanar CT image reconstructions were also generated. RADIATION DOSE REDUCTION: This exam was performed according to the departmental dose-optimization program which includes  automated exposure control, adjustment of the mA and/or kV according to patient size and/or use of iterative reconstruction technique. COMPARISON:  None Available. FINDINGS: Alignment: Cervical spine straightening.  No significant listhesis. Skull base and vertebrae: No acute fracture or suspicious lesion. Soft tissues and spinal canal: No prevertebral fluid or swelling. No visible canal hematoma. Disc levels: Moderate cervical disc degeneration, greatest from C4-5 through C6-7. Predominantly upper cervical facet  arthrosis, moderately severe on the left at C2-3. No evidence of high-grade spinal canal stenosis. Upper chest: Clear lung apices. Other: None. IMPRESSION: 1. No acute cervical spine fracture. 2. Moderate cervical disc and facet degeneration. Electronically Signed   By: Dasie Hamburg M.D.   On: 11/05/2023 14:03   CT HEAD CODE STROKE WO CONTRAST Result Date: 11/05/2023 CLINICAL DATA:  Code stroke. Neuro deficit, acute, stroke suspected. Slurred speech and weakness. EXAM: CT HEAD WITHOUT CONTRAST TECHNIQUE: Contiguous axial images were obtained from the base of the skull through the vertex without intravenous contrast. RADIATION DOSE REDUCTION: This exam was performed according to the departmental dose-optimization program which includes automated exposure control, adjustment of the mA and/or kV according to patient size and/or use of iterative reconstruction technique. COMPARISON:  None available. FINDINGS: Brain: There is no evidence of an acute infarct, intracranial hemorrhage, mass, midline shift, or extra-axial fluid collection. Cerebral volume is normal. The ventricles are normal in size. Patchy a confluent hypodensities in the cerebral white matter are nonspecific but compatible with moderate chronic small vessel ischemic disease. Vascular: Calcified atherosclerosis at the skull base. No hyperdense vessel. Skull: No fracture or suspicious lesion. Sinuses/Orbits: Visualized paranasal sinuses and mastoid air cells are clear. Unremarkable orbits. Other: None. ASPECTS ([place] Stroke Program Early CT Score) - Ganglionic level infarction (caudate, lentiform nuclei, internal capsule, insula, M1-M3 cortex): 7 - Supraganglionic infarction (M4-M6 cortex): 3 Total score (0-10 with 10 being normal): 10 These results were communicated to Dr. Germaine at 1:59 pm on 11/05/2023 by text page via the [facility] messaging system. IMPRESSION: 1. No evidence of acute intracranial abnormality. ASPECTS of 10. 2. Moderate chronic small  vessel ischemic disease. Electronically Signed   By: Dasie Hamburg M.D.   On: 11/05/2023 14:00        Scheduled Meds:   stroke: early stages of recovery book   Does not apply Once   insulin  aspart  0-20 Units Subcutaneous Q4H   mupirocin  ointment  1 Application Nasal BID   Continuous Infusions:          Sophie Mao, MD [facility] 11/07/2023, 7:55 AM

## 2023-11-07 NOTE — Plan of Care (Signed)
  Problem: Acute Rehab PT Goals(only PT should resolve) Goal: Pt Will Go Supine/Side To Sit Outcome: Progressing Flowsheets (Taken 11/07/2023 1116) Pt will go Supine/Side to Sit: with minimal assist Goal: Patient Will Transfer Sit To/From Stand Outcome: Progressing Flowsheets (Taken 11/07/2023 1116) Patient will transfer sit to/from stand: with minimal assist Goal: Pt Will Transfer Bed To Chair/Chair To Bed Outcome: Progressing Flowsheets (Taken 11/07/2023 1116) Pt will Transfer Bed to Chair/Chair to Bed: with min assist Goal: Pt Will Ambulate Outcome: Progressing Flowsheets (Taken 11/07/2023 1116) Pt will Ambulate:  15 feet  with minimal assist  with rolling walker Goal: Pt/caregiver will Perform Home Exercise Program Outcome: Progressing Flowsheets (Taken 11/07/2023 1116) Pt/caregiver will Perform Home Exercise Program:  For increased ROM  For increased strengthening  For improved balance  Independently   Sherry Burgess, PT, DPT

## 2023-11-07 NOTE — Plan of Care (Signed)
  Problem: Education: Goal: Ability to describe self-care measures that may prevent or decrease complications (Diabetes Survival Skills Education) will improve Outcome: Progressing   Problem: Coping: Goal: Ability to adjust to condition or change in health will improve Outcome: Progressing   Problem: Fluid Volume: Goal: Ability to maintain a balanced intake and output will improve Outcome: Progressing   Problem: Education: Goal: Knowledge of disease or condition will improve Outcome: Progressing   Problem: Ischemic Stroke/TIA Tissue Perfusion: Goal: Complications of ischemic stroke/TIA will be minimized Outcome: Progressing   Problem: Coping: Goal: Will verbalize positive feelings about self Outcome: Progressing   Problem: Self-Care: Goal: Ability to participate in self-care as condition permits will improve Outcome: Progressing   Problem: Nutrition: Goal: Risk of aspiration will decrease Outcome: Progressing   Problem: Education: Goal: Knowledge of General Education information will improve Description: Including pain rating scale, medication(s)/side effects and non-pharmacologic comfort measures Outcome: Progressing   Problem: Health Behavior/Discharge Planning: Goal: Ability to manage health-related needs will improve Outcome: Progressing   Problem: Clinical Measurements: Goal: Ability to maintain clinical measurements within normal limits will improve Outcome: Progressing Goal: Will remain free from infection Outcome: Progressing Goal: Diagnostic test results will improve Outcome: Progressing Goal: Respiratory complications will improve Outcome: Progressing Goal: Cardiovascular complication will be avoided Outcome: Progressing   Problem: Activity: Goal: Risk for activity intolerance will decrease Outcome: Progressing   Problem: Nutrition: Goal: Adequate nutrition will be maintained Outcome: Progressing   Problem: Coping: Goal: Level of anxiety will  decrease Outcome: Progressing   Problem: Elimination: Goal: Will not experience complications related to bowel motility Outcome: Progressing Goal: Will not experience complications related to urinary retention Outcome: Progressing   Problem: Pain Managment: Goal: General experience of comfort will improve and/or be controlled Outcome: Progressing

## 2023-11-07 NOTE — Evaluation (Signed)
 Physical Therapy Evaluation Patient Details Name: Sherry Burgess MRN: [id number] DOB: 05-Oct-1950 Today's Date: 11/07/2023  History of Present Illness  Sherry Burgess is a 74 y.o. female with medical history significant of hypertension, hyperlipidemia, diabetes mellitus type II and asthma presented with a fall today.  I reviewed patient's medical records including nursing notes and teleneurology consultation note myself.  Patient apparently was at home caring for an infant.  When she put the infant down, she tripped and fell backward from a bag that was on the floor.  She hit the back of her head on a metal filing cabinet and had sudden onset of slurred speech.  Per EMS, patient had intermittent right arm weakness.  No fever, abdominal pain, cough, shortness of breath, vomiting, diarrhea, dysuria or chest pain reported.   Clinical Impression  Pt was agreeable to PT and OT co-evaluation. She was modA for all bed mobility and transfers due to difficulty mobilizing her RLE. She required the use of the RW for ambulation to limit the WB through her RLE. She was left in the chair with the call bell within reach, chair alarm activated, and family present. Patient will benefit from continued skilled physical therapy in hospital and recommended venue below to increase strength, balance, endurance for safe ADLs and gait.         If plan is discharge home, recommend the following: A lot of help with walking and/or transfers;A lot of help with bathing/dressing/bathroom;Assistance with cooking/housework;Assist for transportation   Can travel by private vehicle        Equipment Recommendations Rolling walker (2 wheels)  Recommendations for Other Services       Functional Status Assessment Patient has had a recent decline in their functional status and demonstrates the ability to make significant improvements in function in a reasonable and predictable amount of time.     Precautions / Restrictions  Precautions Precautions: Fall Recall of Precautions/Restrictions: Intact Restrictions Weight Bearing Restrictions Per Provider Order: Yes RLE Weight Bearing Per Provider Order: Weight bearing as tolerated      Mobility  Bed Mobility Overal bed mobility: Needs Assistance Bed Mobility: Supine to Sit     Supine to sit: Mod assist     General bed mobility comments: EOB slightly elevated; labore movement; assist to pull to sit and move R LE.    Transfers Overall transfer level: Needs assistance Equipment used: Rolling walker (2 wheels) Transfers: Sit to/from Stand, Bed to chair/wheelchair/BSC Sit to Stand: Mod assist   Step pivot transfers: Mod assist       General transfer comment: labored movement; increase of pain in R LE; extended time and cuing for technique.    Ambulation/Gait Ambulation/Gait assistance: Mod assist Gait Distance (Feet): 5 Feet Assistive device: Rolling walker (2 wheels) Gait Pattern/deviations: Step-to pattern, Decreased stance time - right, Decreased weight shift to right, Knee flexed in stance - right Gait velocity: slow     General Gait Details: pt limited WB through RLE due to pain  Stairs            Wheelchair Mobility     Tilt Bed    Modified Rankin (Stroke Patients Only)       Balance Overall balance assessment: Needs assistance Sitting-balance support: Feet supported, Bilateral upper extremity supported Sitting balance-Leahy Scale: Fair Sitting balance - Comments: seated at EOB   Standing balance support: Bilateral upper extremity supported, During functional activity, Reliant on assistive device for balance Standing balance-Leahy Scale: Poor Standing balance comment:  poor to fiar with RW                             Pertinent Vitals/Pain Pain Assessment Pain Assessment: 0-10 Pain Score: 7  Pain Location: R leg Pain Descriptors / Indicators: Numbness, Shooting Pain Intervention(s): Limited activity  within patient's tolerance, Monitored during session, Premedicated before session, Repositioned    Home Living Family/patient expects to be discharged to:: Private residence Living Arrangements: Spouse/significant other;Children Available Help at Discharge: Family;Available 24 hours/day Type of Home: House Home Access: Stairs to enter Entrance Stairs-Rails: None (usually leans on a chair) Entrance Stairs-Number of Steps: 1   Home Layout: One level Home Equipment: Cane - single point;Shower seat;Other (comment) (pt feels that she may have a RW, but she does not trust it or feel that it is solid)      Prior Function Prior Level of Function : Independent/Modified Independent;Driving             Mobility Comments: PRN use of SPC for community ambulation. ADLs Comments: Independent ; drives     Extremity/Trunk Assessment   Upper Extremity Assessment Upper Extremity Assessment: Defer to OT evaluation    Lower Extremity Assessment Lower Extremity Assessment: Generalized weakness    Cervical / Trunk Assessment Cervical / Trunk Assessment: Kyphotic  Communication   Communication Communication: No apparent difficulties    Cognition Arousal: Alert Behavior During Therapy: WFL for tasks assessed/performed   PT - Cognitive impairments: No apparent impairments                         Following commands: Intact       Cueing       General Comments      Exercises     Assessment/Plan    PT Assessment Patient needs continued PT services  PT Problem List Decreased strength;Decreased range of motion;Decreased activity tolerance;Decreased balance;Decreased mobility;Pain       PT Treatment Interventions DME instruction;Gait training;Stair training;Patient/family education;Functional mobility training;Therapeutic activities;Therapeutic exercise;Balance training;Neuromuscular re-education    PT Goals (Current goals can be found in the Care Plan section)  Acute  Rehab PT Goals Patient Stated Goal: Pt would like to safely return home PT Goal Formulation: With patient/family Time For Goal Achievement: 11/14/23 Potential to Achieve Goals: Good    Frequency Min 4X/week     Co-evaluation PT/OT/SLP Co-Evaluation/Treatment: Yes Reason for Co-Treatment: To address functional/ADL transfers PT goals addressed during session: Mobility/safety with mobility;Balance OT goals addressed during session: ADL's and self-care       AM-PAC PT 6 Clicks Mobility  Outcome Measure Help needed turning from your back to your side while in a flat bed without using bedrails?: A Lot Help needed moving from lying on your back to sitting on the side of a flat bed without using bedrails?: A Lot Help needed moving to and from a bed to a chair (including a wheelchair)?: A Lot Help needed standing up from a chair using your arms (e.g., wheelchair or bedside chair)?: A Lot Help needed to walk in hospital room?: A Lot Help needed climbing 3-5 steps with a railing? : Total 6 Click Score: 11    End of Session Equipment Utilized During Treatment: Gait belt Activity Tolerance: Patient tolerated treatment well Patient left: in chair;with call bell/phone within reach;with chair alarm set;with family/visitor present   PT Visit Diagnosis: Pain;Muscle weakness (generalized) (M62.81);Difficulty in walking, not elsewhere classified (R26.2) Pain - Right/Left:  Right Pain - part of body: Hip    Time: 9141-9063 PT Time Calculation (min) (ACUTE ONLY): 38 min   Charges:   PT Evaluation $PT Eval Moderate Complexity: 1 Mod PT Treatments $Therapeutic Activity: 23-37 mins PT General Charges $$ ACUTE PT VISIT: 1 Visit         Lacinda Fass, PT, DPT  11/07/2023, 11:10 AM

## 2023-11-07 NOTE — Evaluation (Signed)
 Occupational Therapy Evaluation Patient Details Name: Sherry Burgess MRN: [id number] DOB: 1950-11-22 Today's Date: 11/07/2023   History of Present Illness   Sherry Burgess is a 73 y.o. female with medical history significant of hypertension, hyperlipidemia, diabetes mellitus type II and asthma presented with a fall today.  I reviewed patient's medical records including nursing notes and teleneurology consultation note myself.  Patient apparently was at home caring for an infant.  When she put the infant down, she tripped and fell backward from a bag that was on the floor.  She hit the back of her head on a metal filing cabinet and had sudden onset of slurred speech.  Per EMS, patient had intermittent right arm weakness.  No fever, abdominal pain, cough, shortness of breath, vomiting, diarrhea, dysuria or chest pain reported. (per MD)     Clinical Impressions Pt agreeable to OT and PT co-evaluation. Pt is independent at baseline with PRN use of SPC. Today pt required mod A for bed mobility and mod A for EOB to chair transfer with RW. Pt was able to take a couple steps forward and backward with RW. Unable to complete lower body dressing without much assist at this time. B UE generally weak but with good functional use. Pt left in the chair with call bell within reach, family present, and chair alarm set. Education given on tub bench and other AE for assisting with lower body ADL's. Pt will benefit from continued OT in the hospital to increase strength, balance, and endurance for safe ADL's.        If plan is discharge home, recommend the following:   A lot of help with walking and/or transfers;A lot of help with bathing/dressing/bathroom;Assistance with cooking/housework;Assist for transportation;Help with stairs or ramp for entrance     Functional Status Assessment   Patient has had a recent decline in their functional status and demonstrates the ability to make significant improvements  in function in a reasonable and predictable amount of time.     Equipment Recommendations   BSC/3in1;Tub/shower bench             Precautions/Restrictions   Precautions Precautions: Fall Recall of Precautions/Restrictions: Intact Restrictions Weight Bearing Restrictions Per Provider Order: Yes RLE Weight Bearing Per Provider Order: Weight bearing as tolerated     Mobility Bed Mobility Overal bed mobility: Needs Assistance Bed Mobility: Supine to Sit     Supine to sit: Mod assist     General bed mobility comments: EOB slightly elevated; labore movement; assist to pull to sit and move R LE.    Transfers Overall transfer level: Needs assistance Equipment used: Rolling walker (2 wheels) Transfers: Sit to/from Stand, Bed to chair/wheelchair/BSC Sit to Stand: Mod assist     Step pivot transfers: Mod assist     General transfer comment: labored movement; increase of pain in R LE; extended time and cuing for technique.      Balance Overall balance assessment: Needs assistance Sitting-balance support: Feet supported, Bilateral upper extremity supported Sitting balance-Leahy Scale: Fair Sitting balance - Comments: seated at EOB   Standing balance support: Bilateral upper extremity supported, During functional activity, Reliant on assistive device for balance Standing balance-Leahy Scale: Poor Standing balance comment: poor to fiar with RW                           ADL either performed or assessed with clinical judgement   ADL Overall ADL's : Needs assistance/impaired  Grooming: Set up;Sitting   Upper Body Bathing: Set up;Sitting   Lower Body Bathing: Maximal assistance;Sitting/lateral leans   Upper Body Dressing : Set up;Sitting   Lower Body Dressing: Maximal assistance;Sitting/lateral leans   Toilet Transfer: Moderate assistance;Stand-pivot;Rolling walker (2 wheels) Toilet Transfer Details (indicate cue type and reason): EOB to chair  with RW Toileting- Clothing Manipulation and Hygiene: Moderate assistance;Maximal assistance;Sitting/lateral lean       Functional mobility during ADLs: Moderate assistance;Rolling walker (2 wheels) General ADL Comments: Ambulation in the room with RW for a couple steps forward and backwards.     Vision Baseline Vision/History: 1 Wears glasses Ability to See in Adequate Light: 1 Impaired Patient Visual Report: No change from baseline Vision Assessment?: No apparent visual deficits     Perception Perception: Not tested       Praxis Praxis: Not tested       Pertinent Vitals/Pain Pain Assessment Pain Assessment: 0-10 Pain Score: 7  Pain Location: R leg Pain Descriptors / Indicators: Numbness, Shooting Pain Intervention(s): Limited activity within patient's tolerance, Monitored during session, Repositioned, Premedicated before session     Extremity/Trunk Assessment Upper Extremity Assessment Upper Extremity Assessment: Generalized weakness   Lower Extremity Assessment Lower Extremity Assessment: Defer to PT evaluation   Cervical / Trunk Assessment Cervical / Trunk Assessment: Kyphotic   Communication Communication Communication: No apparent difficulties   Cognition Arousal: Alert Behavior During Therapy: WFL for tasks assessed/performed Cognition: No apparent impairments                               Following commands: Intact       Cueing  General Comments   Cueing Techniques: Verbal cues;Tactile cues                 Home Living Family/patient expects to be discharged to:: Private residence Living Arrangements: Spouse/significant other;Children Available Help at Discharge: Family;Available 24 hours/day Type of Home: House Home Access: Stairs to enter [employer] of Steps: 1 Entrance Stairs-Rails: None (usually leans on a chair) Home Layout: One level     Bathroom Shower/Tub: Chief Strategy Officer:  Standard Bathroom Accessibility: Yes How Accessible: Accessible via walker Home Equipment: Cane - single point;Shower seat;Other (comment) (Pt reports that she thinks she has a RW but that it is not a solid one that she trusts.)          Prior Functioning/Environment Prior Level of Function : Independent/Modified Independent;Driving             Mobility Comments: PRN use of SPC for community ambulation. ADLs Comments: Independent ; drives    OT Problem List: Decreased strength;Decreased range of motion;Decreased activity tolerance;Impaired balance (sitting and/or standing);Obesity   OT Treatment/Interventions: Self-care/ADL training;Therapeutic exercise;Energy conservation;DME and/or AE instruction;Therapeutic activities;Patient/family education;Balance training      OT Goals(Current goals can be found in the care plan section)   Acute Rehab OT Goals Patient Stated Goal: return home OT Goal Formulation: With patient/family Time For Goal Achievement: 11/21/23 Potential to Achieve Goals: Good   OT Frequency:  Min 3X/week    Co-evaluation PT/OT/SLP Co-Evaluation/Treatment: Yes Reason for Co-Treatment: To address functional/ADL transfers   OT goals addressed during session: ADL's and self-care                       End of Session Equipment Utilized During Treatment: Rolling walker (2 wheels);Gait belt  Activity Tolerance: Patient tolerated treatment well  Patient left: in chair;with call bell/phone within reach;with chair alarm set;with family/visitor present  OT Visit Diagnosis: Unsteadiness on feet (R26.81);Other abnormalities of gait and mobility (R26.89);Muscle weakness (generalized) (M62.81);History of falling (Z91.81);Dizziness and giddiness (R42)                Time: 9142-9060 OT Time Calculation (min): 42 min Charges:  OT General Charges $OT Visit: 1 Visit OT Evaluation $OT Eval Low Complexity: 1 Low  Sherry Burgess OT, MOT   Jayson Person 11/07/2023, 10:52 AM

## 2023-11-07 NOTE — Plan of Care (Signed)
  Problem: Acute Rehab OT Goals (only OT should resolve) Goal: Pt. Will Perform Grooming Flowsheets (Taken 11/07/2023 1056) Pt Will Perform Grooming:  with contact guard assist  standing Goal: Pt. Will Perform Lower Body Bathing Flowsheets (Taken 11/07/2023 1056) Pt Will Perform Lower Body Bathing:  with min assist  sitting/lateral leans  with adaptive equipment  with contact guard assist Goal: Pt. Will Perform Lower Body Dressing Flowsheets (Taken 11/07/2023 1056) Pt Will Perform Lower Body Dressing:  with contact guard assist  with min assist  with adaptive equipment  sitting/lateral leans Goal: Pt. Will Transfer To Toilet Flowsheets (Taken 11/07/2023 1056) Pt Will Transfer to Toilet:  with contact guard assist  stand pivot transfer Goal: Pt. Will Perform Toileting-Clothing Manipulation Flowsheets (Taken 11/07/2023 1056) Pt Will Perform Toileting - Clothing Manipulation and hygiene:  with contact guard assist  with min assist  sitting/lateral leans Goal: Pt/Caregiver Will Perform Home Exercise Program Flowsheets (Taken 11/07/2023 1056) Pt/caregiver will Perform Home Exercise Program:  Increased strength  Both right and left upper extremity  Independently  Derry Kassel OT, MOT

## 2023-11-08 DIAGNOSIS — S72001A Fracture of unspecified part of neck of right femur, initial encounter for closed fracture: Secondary | ICD-10-CM | POA: Diagnosis not present

## 2023-11-08 LAB — GLUCOSE, CAPILLARY
Glucose-Capillary: 104 mg/dL — ABNORMAL HIGH (ref 70–99)
Glucose-Capillary: 143 mg/dL — ABNORMAL HIGH (ref 70–99)
Glucose-Capillary: 166 mg/dL — ABNORMAL HIGH (ref 70–99)

## 2023-11-08 MED ORDER — SENNOSIDES-DOCUSATE SODIUM 8.6-50 MG PO TABS: 1.0000 | ORAL_TABLET | Freq: Two times a day (BID) | ORAL | 0 refills | Status: AC

## 2023-11-08 MED ORDER — ASPIRIN EC 81 MG PO TBEC: 81.0000 mg | DELAYED_RELEASE_TABLET | Freq: Two times a day (BID) | ORAL | 0 refills | Status: AC

## 2023-11-08 MED ORDER — POLYETHYLENE GLYCOL 3350 17 G PO PACK: 17.0000 g | PACK | Freq: Every day | ORAL | 0 refills | Status: AC | PRN

## 2023-11-08 MED ORDER — OXYCODONE HCL 5 MG PO TABS: 5.0000 mg | ORAL_TABLET | Freq: Four times a day (QID) | ORAL | 0 refills | Status: DC | PRN

## 2023-11-08 NOTE — TOC Transition Note (Signed)
 Transition of Care [facility]) - Discharge Note   Patient Details  Name: Sherry Burgess MRN: [id number] Date of Birth: 06/18/1950  [facility] CM/SW Contact:  Nena LITTIE Coffee, RN Phone Number: 11/08/2023, 1:09 PM   Clinical Narrative:    Pt to dc c/[facility],OT and rolling walker from [employer]. Family to transport home.   Final next level of care: Home w Home Health Services Barriers to Discharge: Barriers Resolved   Patient Goals and CMS Choice Patient states their goals for this hospitalization and ongoing recovery are:: To get better to be able to go home CMS Medicare.gov Compare Post Acute Care list provided to:: Patient Choice offered to / list presented to : Patient [place] ownership interest in [facility].provided to::  (NA)    Discharge Placement                       Discharge Plan and Services Additional resources added to the After Visit Summary for   In-house Referral: Clinical Social Work Discharge Planning Services: NA Post Acute Care Choice: Home Health, Durable Medical Equipment          DME Arranged: Walker rolling, Bedside commode DME Agency: [employer] Date DME Agency Contacted: 11/07/23 Time DME Agency Contacted: 1054 Representative spoke with at DME Agency: Zack HH Arranged: PT, OT HH Agency: [facility] Date [facility] Agency Contacted: 11/07/23 Time HH Agency Contacted: 1054 Representative spoke with at [facility] Agency: Joane  Social Drivers of Health (SDOH) Interventions SDOH Screenings   Food Insecurity: No Food Insecurity (11/05/2023)  Housing: Low Risk  (11/05/2023)  Transportation Needs: No Transportation Needs (11/05/2023)  Utilities: Not At Risk (11/05/2023)  Social Connections: Moderately Integrated (11/05/2023)  Tobacco Use: Medium Risk (11/06/2023)     Readmission Risk Interventions    11/06/2023    1:13 PM  Readmission Risk Prevention Plan  Post Dischage Appt Complete  Medication Screening Complete   Transportation Screening Complete

## 2023-11-08 NOTE — Discharge Summary (Signed)
 Physician Discharge Summary  Sherry Burgess FMW:[id number] DOB: [birth year] DOA: 11/05/2023  PCP: Luke Agent, MD (Inactive)  Admit date: 11/05/2023 Discharge date: 11/08/2023  Admitted From: Home Disposition: Home  Recommendations for Outpatient Follow-up:  Follow up with PCP in 1 week  Outpatient follow-up with orthopedics.  Wound care as per orthopedics recommendations. Follow up in ED if symptoms worsen or new appear   Home Health: No Equipment/Devices: None  Discharge Condition: Stable CODE STATUS: Full Diet recommendation: Heart healthy/carb modified  Brief/Interim Summary: 73 y.o. female with medical history significant of hypertension, hyperlipidemia, diabetes mellitus type II and asthma presented with a fall followed by.  Of slurred speech.  On presentation, Code stroke was called and patient was evaluated by teleneurology. CT of the head was negative for acute intracranial abnormality. Chest x-ray negative for acute active disease. X-ray of right hip showed right femoral neck fracture. CT of cervical spine showed no acute cervical spine fracture. CTA of head and neck showed no evidence of LVO. Teleneurology recommended MRI of the brain. Patient's neurological symptoms were improving in the ED. Orthopedics was consulted.  MRI of brain was negative for acute stroke.  She underwent surgical intervention by orthopedics on 11/06/2023.  Subsequently PT/OT recommended home health PT/OT.  She is currently stable for discharge.  She will be discharged home today with outpatient follow-up with PCP and orthopedics.  Discharge Diagnoses:   Right femoral neck fracture following a probable mechanical fall - underwent surgical intervention by orthopedics on 11/06/2023.  Wound care activity as per orthopedics recommendations.  Orthopedics recommending aspirin  81 mg twice daily for DVT prophylaxis - PT/OT recommended home health PT/OT.  She is currently stable for discharge.  She will be discharged  home today with outpatient follow-up with PCP and orthopedics.   Slurred speech - Happened after the fall.  Code stroke activated on presentation.  CT head and CTA of the head and neck negative.  Teleneurology recommending MRI of brain.  Symptoms have much improved.  MRI brain negative for acute stroke.  - No further workup needed.   Hypertension -- Resume hydrochlorothiazide on discharge.  Hold losartan till reevaluation by PCP.  Blood pressures currently not elevated  Hypokalemia - Resolved   Diabetes mellitus type 2 - Resume long-acting insulin .  Hold glipizide till reevaluation by PCP.  Carb modified diet.    Elevated LFTs - Mild.  Chronic.  Questionable cause.  Improved from prior.  Outpatient follow-up with GI   Hyperlipidemia - Resume gemfibrozil on discharge.  Intolerant to statins.  Obesity class I - Outpatient follow-up   Discharge Instructions   Allergies as of 11/08/2023       Reactions   Statins Other (See Comments)   Myopathies, muscle paralysis   Canagliflozin Itching   Empagliflozin Itching        Medication List     STOP taking these medications    glipiZIDE 10 MG 24 hr tablet Commonly known as: GLUCOTROL XL   losartan 100 MG tablet Commonly known as: COZAAR   multivitamin with minerals Tabs tablet       TAKE these medications    aspirin  EC 81 MG tablet Take 1 tablet (81 mg total) by mouth 2 (two) times daily. What changed: when to take this   fluticasone 50 MCG/ACT nasal spray Commonly known as: FLONASE Place 1 spray into both nostrils as needed for allergies or rhinitis.   FreeStyle Libre 2 Sensor Misc   gemfibrozil 600 MG tablet Commonly known as: LOPID  Take 600 mg by mouth 2 (two) times daily.   hydrochlorothiazide 25 MG tablet Commonly known as: HYDRODIURIL Take 25 mg by mouth daily.   omeprazole 10 MG capsule Commonly known as: PRILOSEC Take 10 mg by mouth daily as needed (heartburn, acid reflux).   oxyCODONE  5 MG  immediate release tablet Commonly known as: Oxy IR/ROXICODONE  Take 1-2 tablets (5-10 mg total) by mouth every 6 (six) hours as needed for moderate pain (pain score 4-6).   polyethylene glycol 17 g packet Commonly known as: MIRALAX  / GLYCOLAX  Take 17 g by mouth daily as needed for moderate constipation.   senna-docusate 8.6-50 MG tablet Commonly known as: Senokot-S Take 1 tablet by mouth 2 (two) times daily.   Tresiba FlexTouch 100 UNIT/ML FlexTouch Pen Generic drug: insulin  degludec Inject 20 Units into the skin at bedtime. <200   Vitamin D3 50 MCG (2000 UT) Tabs Take 2,000 Units by mouth daily.               Durable Medical Equipment  (From admission, onward)           Start     Ordered   11/07/23 1119  For home use only DME Walker rolling  Once       Question Answer Comment  Walker: With 5 Inch Wheels   Patient needs a walker to treat with the following condition Unsteadiness on feet      11/07/23 1119   11/07/23 1100  For home use only DME Walker rolling  Once       Question Answer Comment  Walker: With 5 Inch Wheels   Patient needs a walker to treat with the following condition Weakness      11/07/23 1059   11/07/23 1036  For home use only DME 3 n 1  Once        11/07/23 1036            Follow-up Information     [facility] Follow up.   Specialty: Home Health Services Why: Hedda will follow up with you at discharge to provide home health physical and occupational therapy Contact information: [address] [place] [address] [phone]         Luke Agent, MD. Schedule an appointment as soon as possible for a visit in 1 week(s).   Specialty: Internal Medicine Contact information: [address] [place] [address] [phone]         Onesimo Oneil LABOR, MD. Schedule an appointment as soon as possible for a visit in 1 week(s).   Specialties: Orthopedic Surgery, Sports Medicine Contact  information: [address] [place] [address] [phone]                Allergies  Allergen Reactions   Statins Other (See Comments)    Myopathies, muscle paralysis   Canagliflozin Itching   Empagliflozin Itching    Consultations: Orthopedics   Procedures/Studies: DG HIP UNILAT WITH PELVIS 2-3 VIEWS RIGHT Result Date: 11/06/2023 CLINICAL DATA:  [id number] Femoral neck fracture (HCC) 95717; [id number] Pain 855384. EXAM: RIGHT FEMUR PORTABLE 2 VIEW; DG HIP (WITH OR WITHOUT PELVIS) 2-3V RIGHT COMPARISON:  None Available. FINDINGS: Status post open reduction and internal fixation of intertrochanteric proximal right femur fracture with trochanteric fixation nail and fixation screws. No periprosthetic fracture or lucency. There is near anatomic alignment. No other acute fracture or dislocation. No aggressive osseous lesion. Unremarkable symphysis pubis. There are mild degenerative changes of bilateral  hip joints. No radiopaque foreign bodies. IMPRESSION: *Status post open reduction and internal fixation of intertrochanteric proximal right femur fracture with trochanteric fixation nail and fixation screws. No periprosthetic fracture or lucency. There is near anatomic alignment. No other acute fracture or dislocation. Electronically Signed   By: Ree Molt M.D.   On: 11/06/2023 11:49   DG FEMUR PORT, MIN 2 VIEWS RIGHT Result Date: 11/06/2023 CLINICAL DATA:  [id number] Femoral neck fracture (HCC) 95717; [id number] Pain 855384. EXAM: RIGHT FEMUR PORTABLE 2 VIEW; DG HIP (WITH OR WITHOUT PELVIS) 2-3V RIGHT COMPARISON:  None Available. FINDINGS: Status post open reduction and internal fixation of intertrochanteric proximal right femur fracture with trochanteric fixation nail and fixation screws. No periprosthetic fracture or lucency. There is near anatomic alignment. No other acute fracture or dislocation. No aggressive osseous lesion. Unremarkable symphysis pubis. There are mild degenerative changes of  bilateral hip joints. No radiopaque foreign bodies. IMPRESSION: *Status post open reduction and internal fixation of intertrochanteric proximal right femur fracture with trochanteric fixation nail and fixation screws. No periprosthetic fracture or lucency. There is near anatomic alignment. No other acute fracture or dislocation. Electronically Signed   By: Ree Molt M.D.   On: 11/06/2023 11:49   DG Pelvis Portable Result Date: 11/06/2023 CLINICAL DATA:  [id number] Femoral neck fracture (HCC) 95717. EXAM: PORTABLE PELVIS 1-2 VIEWS COMPARISON:  11/05/2023 FINDINGS: Status post open reduction and internal fixation of intertrochanteric proximal right femur fracture with trochanteric fixation nail and helical blade. No periprosthetic fracture or lucency. There is near anatomic alignment. No other acute fracture or dislocation. No aggressive osseous lesion. Visualized sacral arcuate lines are unremarkable. Unremarkable symphysis pubis. There are mild degenerative changes of bilateral hip joints characterized by mild joint space narrowing and osteophytosis of the superior acetabulum. No radiopaque foreign bodies. IMPRESSION: *Status post open reduction and internal fixation of intertrochanteric proximal right femur fracture. No periprosthetic fracture or lucency. Electronically Signed   By: Ree Molt M.D.   On: 11/06/2023 11:45   DG C-Arm 1-60 Min-No Report Result Date: 11/06/2023 Fluoroscopy was utilized by the requesting physician.  No radiographic interpretation.   MR BRAIN WO CONTRAST Result Date: 11/05/2023 EXAM: MRI BRAIN WITHOUT CONTRAST 11/05/2023 05:46:16 PM TECHNIQUE: Multiplanar multisequence MRI of the head/brain was performed without the administration of intravenous contrast. COMPARISON: CT head and CTA head and neck earlier same day. CLINICAL HISTORY: Mental status change, unknown cause. FINDINGS: BRAIN AND VENTRICLES: No acute infarct. No intracranial hemorrhage. No mass. No midline shift. No  hydrocephalus. The sella is unremarkable. Normal flow voids. Scattered T2/FLAIR hyperintensity in the periventricular and subcortical white matter, compatible with mild-to-moderate chronic microvascular ischemic changes. ORBITS: No acute abnormality. SINUSES AND MASTOIDS: No acute abnormality. BONES AND SOFT TISSUES: Normal marrow signal. No acute soft tissue abnormality. IMPRESSION: 1. No acute intracranial abnormality. 2. Mild-to-moderate chronic microvascular ischemic changes. Electronically signed by: Donnice Mania MD 11/05/2023 07:29 PM EDT RP Workstation: [id number]   CT ANGIO HEAD NECK W WO CM (CODE STROKE) Result Date: 11/05/2023 CLINICAL DATA:  Neuro deficit, acute, stroke suspected. Slurred speech and right arm weakness. EXAM: CT ANGIOGRAPHY HEAD AND NECK WITH AND WITHOUT CONTRAST TECHNIQUE: Multidetector CT imaging of the head and neck was performed using the standard protocol during bolus administration of intravenous contrast. Multiplanar CT image reconstructions and MIPs were obtained to evaluate the vascular anatomy. Carotid stenosis measurements (when applicable) are obtained utilizing NASCET criteria, using the distal internal carotid diameter as the denominator. RADIATION DOSE REDUCTION: This exam was performed according  to the departmental dose-optimization program which includes automated exposure control, adjustment of the mA and/or kV according to patient size and/or use of iterative reconstruction technique. CONTRAST:  75mL OMNIPAQUE  IOHEXOL  350 MG/ML SOLN COMPARISON:  None Available. FINDINGS: CTA NECK FINDINGS Aortic arch: Normal variant aortic arch branching pattern with common origin of the brachiocephalic and left common carotid arteries. Mild atherosclerosis without a significant stenosis of the arch vessel origins. Right carotid system: Patent with mixed plaque at the carotid bifurcation. No evidence of a significant stenosis or dissection. Left carotid system: Patent with minimal  mixed plaque at the carotid bifurcation. No evidence of a significant stenosis or dissection. Vertebral arteries: Patent and codominant. Mild plaque without evidence of a significant stenosis or dissection. Skeleton: No acute osseous abnormality or suspicious lesion. Cervical spine reported separately. Other neck: No evidence of cervical lymphadenopathy or mass. Upper chest: Clear lung apices. Review of the MIP images confirms the above findings CTA HEAD FINDINGS Anterior circulation: The intracranial internal carotid arteries are patent with extensive atherosclerosis resulting in multiple stenoses bilaterally including severe stenoses of the right last surround, bilateral cavernous, and left ophthalmic segments. ACAs and MCAs are patent without evidence of a proximal branch occlusion or significant proximal stenosis. The left A1 segment is dominant. Posterior circulation: The intracranial vertebral arteries are patent to the basilar with asymmetric left-sided atherosclerosis resulting in severe left distal V4 stenosis. There is at least mild stenosis of the distal right V4 segment at the vertebrobasilar junction. The basilar artery is patent and irregular with no more than mild multifocal narrowing. Posterior communicating arteries are diminutive or absent. Both PCAs are patent without evidence of a significant proximal stenosis. No aneurysm is identified. Venous sinuses: Patent. Anatomic variants: None of significance. Review of the MIP images confirms the above findings The finding of no large vessel occlusion was communicated to Dr. Germaine at 2:51 pm on 11/05/2023 by text page via the [facility] messaging system. IMPRESSION: 1. No large vessel occlusion. 2. Intracranial atherosclerosis including severe bilateral ICA and left V4 stenoses. 3. Widely patent cervical carotid arteries. 4.  Aortic Atherosclerosis (ICD10-I70.0). Electronically Signed   By: Dasie Hamburg M.D.   On: 11/05/2023 15:01   DG Hip Unilat W or Wo  Pelvis 2-3 Views Right Result Date: 11/05/2023 CLINICAL DATA:  Fall.  Right hip pain. EXAM: DG HIP (WITH OR WITHOUT PELVIS) 2-3V RIGHT COMPARISON:  None Available. FINDINGS: Minimally displaced fracture of the right femoral neck. No dislocation. The bones are osteopenic. Degenerative changes of the lower lumbar spine. The soft tissues are unremarkable. IMPRESSION: Fracture of the right femoral neck. Electronically Signed   By: Vanetta Chou M.D.   On: 11/05/2023 14:23   DG Chest 1 View Result Date: 11/05/2023 CLINICAL DATA:  Fall, slurred speech. EXAM: CHEST  1 VIEW COMPARISON:  February 12, 2014. FINDINGS: The heart size and mediastinal contours are within normal limits. Both lungs are clear. The visualized skeletal structures are unremarkable. IMPRESSION: No active disease. Electronically Signed   By: Lynwood Landy Raddle M.D.   On: 11/05/2023 14:22   CT Cervical Spine Wo Contrast Result Date: 11/05/2023 CLINICAL DATA:  Neck trauma (Age >= 65y).  Fall. EXAM: CT CERVICAL SPINE WITHOUT CONTRAST TECHNIQUE: Multidetector CT imaging of the cervical spine was performed without intravenous contrast. Multiplanar CT image reconstructions were also generated. RADIATION DOSE REDUCTION: This exam was performed according to the departmental dose-optimization program which includes automated exposure control, adjustment of the mA and/or kV according to patient size  and/or use of iterative reconstruction technique. COMPARISON:  None Available. FINDINGS: Alignment: Cervical spine straightening.  No significant listhesis. Skull base and vertebrae: No acute fracture or suspicious lesion. Soft tissues and spinal canal: No prevertebral fluid or swelling. No visible canal hematoma. Disc levels: Moderate cervical disc degeneration, greatest from C4-5 through C6-7. Predominantly upper cervical facet arthrosis, moderately severe on the left at C2-3. No evidence of high-grade spinal canal stenosis. Upper chest: Clear lung apices.  Other: None. IMPRESSION: 1. No acute cervical spine fracture. 2. Moderate cervical disc and facet degeneration. Electronically Signed   By: Dasie Hamburg M.D.   On: 11/05/2023 14:03   CT HEAD CODE STROKE WO CONTRAST Result Date: 11/05/2023 CLINICAL DATA:  Code stroke. Neuro deficit, acute, stroke suspected. Slurred speech and weakness. EXAM: CT HEAD WITHOUT CONTRAST TECHNIQUE: Contiguous axial images were obtained from the base of the skull through the vertex without intravenous contrast. RADIATION DOSE REDUCTION: This exam was performed according to the departmental dose-optimization program which includes automated exposure control, adjustment of the mA and/or kV according to patient size and/or use of iterative reconstruction technique. COMPARISON:  None available. FINDINGS: Brain: There is no evidence of an acute infarct, intracranial hemorrhage, mass, midline shift, or extra-axial fluid collection. Cerebral volume is normal. The ventricles are normal in size. Patchy a confluent hypodensities in the cerebral white matter are nonspecific but compatible with moderate chronic small vessel ischemic disease. Vascular: Calcified atherosclerosis at the skull base. No hyperdense vessel. Skull: No fracture or suspicious lesion. Sinuses/Orbits: Visualized paranasal sinuses and mastoid air cells are clear. Unremarkable orbits. Other: None. ASPECTS (Alberta Stroke Program Early CT Score) - Ganglionic level infarction (caudate, lentiform nuclei, internal capsule, insula, M1-M3 cortex): 7 - Supraganglionic infarction (M4-M6 cortex): 3 Total score (0-10 with 10 being normal): 10 These results were communicated to Dr. Germaine at 1:59 pm on 11/05/2023 by text page via the [facility] messaging system. IMPRESSION: 1. No evidence of acute intracranial abnormality. ASPECTS of 10. 2. Moderate chronic small vessel ischemic disease. Electronically Signed   By: Dasie Hamburg M.D.   On: 11/05/2023 14:00      Subjective: Patient seen  and examined at bedside.  Complains of intermittent rectal pain.  No fever, vomiting, abdominal pain reported  Discharge Exam: Vitals:   11/07/23 2001 11/08/23 0634  BP: (!) 145/66 124/68  Pulse: 88 91  Resp: 18 18  Temp: 98.4 F (36.9 C) 98.3 F (36.8 C)  SpO2: 92% 93%    General: Pt is alert, awake, not in acute distress.  On room air. Cardiovascular: rate controlled, S1/S2 + Respiratory: bilateral decreased breath sounds at bases Abdominal: Soft, obese, NT, ND, bowel sounds + Extremities: no edema, no cyanosis    The results of significant diagnostics from this hospitalization (including imaging, microbiology, ancillary and laboratory) are listed below for reference.     Microbiology: Recent Results (from the past 240 hours)  Surgical PCR screen     Status: None   Collection Time: 11/06/23  4:15 AM   Specimen: Nasal Mucosa; Nasal Swab  Result Value Ref Range Status   MRSA, PCR NEGATIVE NEGATIVE Final   Staphylococcus aureus NEGATIVE NEGATIVE Final    Comment: (NOTE) The Xpert SA Assay (FDA approved for NASAL specimens in patients 34 years of age and older), is one component of a comprehensive surveillance program. It is not intended to diagnose infection nor to guide or monitor treatment. Performed at [facility], [address], [place], [place] [address]  Labs: BNP (last 3 results) No results for input(s): BNP in the last 8760 hours. Basic Metabolic Panel: Recent Labs  Lab 11/05/23 1333 11/05/23 1335 11/06/23 0359 11/07/23 0307  NA 139 140 137 138  K 3.0* 3.0* 4.0 4.4  CL 101 102 103 102  CO2 23  --  23 27  GLUCOSE 122* 123* 160* 166*  BUN 21 21 16 12   CREATININE 0.71 0.80 0.73 0.76  CALCIUM 9.6  --  8.6* 8.6*  MG  --   --   --  1.9   Liver Function Tests: Recent Labs  Lab 11/05/23 1333 11/06/23 0359  AST 98* 61*  ALT 105* 74*  ALKPHOS 47 39  BILITOT 1.1 1.0  PROT 7.8 6.4*  ALBUMIN 4.6 3.7   No results for input(s): LIPASE,  AMYLASE in the last 168 hours. No results for input(s): AMMONIA in the last 168 hours. CBC: Recent Labs  Lab 11/05/23 1333 11/05/23 1335 11/06/23 0359 11/07/23 0307  WBC 6.2  --  6.6 8.3  NEUTROABS 2.3  --   --  6.3  HGB 14.1 14.3 12.2 11.4*  HCT 42.3 42.0 38.3 36.1  MCV 87.8  --  89.7 92.6  PLT 194  --  181 151   Cardiac Enzymes: No results for input(s): CKTOTAL, CKMB, CKMBINDEX, TROPONINI in the last 168 hours. BNP: Invalid input(s): POCBNP CBG: Recent Labs  Lab 11/07/23 1603 11/07/23 2040 11/07/23 2329 11/08/23 0400 11/08/23 0730  GLUCAP 168* 130* 139* 104* 143*   D-Dimer No results for input(s): DDIMER in the last 72 hours. Hgb A1c Recent Labs    11/05/23 1333  HGBA1C 8.1*   Lipid Profile Recent Labs    11/06/23 0400  CHOL 223*  HDL 25*  LDLCALC UNABLE TO CALCULATE IF TRIGLYCERIDE OVER 400 mg/dL  TRIG 554*  CHOLHDL 8.9   Thyroid  function studies No results for input(s): TSH, T4TOTAL, T3FREE, THYROIDAB in the last 72 hours.  Invalid input(s): FREET3 Anemia work up No results for input(s): VITAMINB12, FOLATE, FERRITIN, TIBC, IRON, RETICCTPCT in the last 72 hours. Urinalysis    Component Value Date/Time   COLORURINE YELLOW 04/10/2015 0837   APPEARANCEUR CLEAR 04/10/2015 0837   LABSPEC 1.025 04/10/2015 0837   PHURINE 5.0 04/10/2015 0837   GLUCOSEU NEGATIVE 04/10/2015 0837   HGBUR TRACE (A) 04/10/2015 0837   BILIRUBINUR NEGATIVE 04/10/2015 0837   KETONESUR NEGATIVE 04/10/2015 0837   PROTEINUR NEGATIVE 04/10/2015 0837   UROBILINOGEN 0.2 02/12/2014 1108   NITRITE NEGATIVE 04/10/2015 0837   LEUKOCYTESUR TRACE (A) 04/10/2015 0837   Sepsis Labs Recent Labs  Lab 11/05/23 1333 11/06/23 0359 11/07/23 0307  WBC 6.2 6.6 8.3   Microbiology Recent Results (from the past 240 hours)  Surgical PCR screen     Status: None   Collection Time: 11/06/23  4:15 AM   Specimen: Nasal Mucosa; Nasal Swab  Result Value Ref  Range Status   MRSA, PCR NEGATIVE NEGATIVE Final   Staphylococcus aureus NEGATIVE NEGATIVE Final    Comment: (NOTE) The Xpert SA Assay (FDA approved for NASAL specimens in patients 53 years of age and older), is one component of a comprehensive surveillance program. It is not intended to diagnose infection nor to guide or monitor treatment. Performed at [facility], [address] [place], [place] [address]      Time coordinating discharge: 35 minutes  SIGNED:   Sophie Mao, MD  [facility] 11/08/2023, 7:36 AM

## 2023-11-08 NOTE — Plan of Care (Signed)

## 2023-11-12 ENCOUNTER — Telehealth: Payer: Self-pay | Admitting: Orthopedic Surgery

## 2023-11-12 NOTE — Telephone Encounter (Signed)
 Dr. Onesimo pt GLENWOOD Anes PT w/[facility] [phone] lvm wanting to know the pt's weight bearing status

## 2023-11-12 NOTE — Telephone Encounter (Signed)
 Left VM stating pt is weightbearing as tolerated. Callback # left incase of more questions or concerns.

## 2023-11-14 ENCOUNTER — Telehealth: Payer: Self-pay | Admitting: Orthopedic Surgery

## 2023-11-14 NOTE — Anesthesia Postprocedure Evaluation (Signed)
 Anesthesia Post Note  Patient: Sherry Burgess  Procedure(s) Performed: FIXATION, FRACTURE, INTERTROCHANTERIC, WITH INTRAMEDULLARY ROD (Right: Hip)  Patient location during evaluation: Phase II Anesthesia Type: General Level of consciousness: awake Pain management: pain level controlled Vital Signs Assessment: post-procedure vital signs reviewed and stable Respiratory status: spontaneous breathing and respiratory function stable Cardiovascular status: blood pressure returned to baseline and stable Postop Assessment: no headache and no apparent nausea or vomiting Anesthetic complications: no Comments: Late entry   No notable events documented.   Last Vitals:  Vitals:   11/08/23 0634 11/08/23 1354  BP: 124/68 111/74  Pulse: 91 87  Resp: 18 18  Temp: 36.8 C 36.7 C  SpO2: 93% 94%    Last Pain:  Vitals:   11/08/23 1354  TempSrc: Oral  PainSc:                  [name]

## 2023-11-14 NOTE — Telephone Encounter (Signed)
 DR. ONESIMO    Patient called this morning lvm wanting to make appt for follow up after surgery.  I called the patient back lvm to call the office.  Per note in chart approximately 2 weeks postop for incision check and XR

## 2023-11-17 ENCOUNTER — Telehealth: Payer: Self-pay

## 2023-11-17 NOTE — Telephone Encounter (Signed)
 Documentation from [facility] where the pt no showed. Scanned to the pt's chart

## 2023-11-19 ENCOUNTER — Encounter: Payer: Self-pay | Admitting: Orthopedic Surgery

## 2023-11-19 ENCOUNTER — Ambulatory Visit: Admitting: Orthopedic Surgery

## 2023-11-19 ENCOUNTER — Other Ambulatory Visit: Payer: Self-pay

## 2023-11-19 DIAGNOSIS — S72001D Fracture of unspecified part of neck of right femur, subsequent encounter for closed fracture with routine healing: Secondary | ICD-10-CM | POA: Diagnosis not present

## 2023-11-19 DIAGNOSIS — S72141G Displaced intertrochanteric fracture of right femur, subsequent encounter for closed fracture with delayed healing: Secondary | ICD-10-CM

## 2023-11-19 MED ORDER — OXYCODONE HCL 5 MG PO TABS: 5.0000 mg | ORAL_TABLET | Freq: Four times a day (QID) | ORAL | 0 refills | Status: AC | PRN

## 2023-11-19 NOTE — Progress Notes (Signed)
 Orthopaedic Postop Note  Assessment: Sherry Burgess is a 73 y.o. female s/p cephalomedullary nail for Right intertrochanteric femur fracture  DOS: 10/06/2023  Plan: Sutures trimmed, steri strips placed Continue with protective WBAT Continue with DVT prophylaxis for at least 6 weeks after surgery WBAT on the operative extremity Follow up in 4 weeks; call with any issues   Follow-up: Return in about 4 weeks (around 12/17/2023). XR at next visit: AP pelvis and Right femur  Subjective:  Chief Complaint  Patient presents with   Hip Injury    History of Present Illness: Sherry Burgess is a 73 y.o. female who presents following the above stated procedure.  Surgery was approximate 2 weeks ago.  She has returned home since surgery.  She is working with home therapy.  No issues with the incisions.  She is able to bear weight, and is ambulating within her home.  Review of Systems: No fevers or chills No numbness or tingling No Chest Pain No shortness of breath   Objective: There were no vitals taken for this visit.  Physical Exam:  Alert and oriented.  No acute distress.  Seated in wheelchair.  Surgical incisions are healing well.  No surrounding erythema or drainage.  Able to maintain a straight leg raise.  Active motion intact in the TA/EHL.  Toes are warm and well-perfused.   IMAGING: I personally ordered and reviewed the following images:  XR of the Right femur and AP pelvis demonstrates a well positioned cephalomedullary nail.   The intertrochanteric femur fracture remains in stable position.  There is no evidence of implant subsidence.  No acute fractures are noted.  Screws are not backing out.  No concern for screw cutout.   Impression: Right intertrochanteric femur fracture in stable position without evidence of hardware failure or subsidence.   Sherry DELENA Horde, MD 11/19/2023 2:14 PM

## 2023-11-25 ENCOUNTER — Other Ambulatory Visit (HOSPITAL_COMMUNITY): Payer: Self-pay | Admitting: Adult Health

## 2023-11-25 ENCOUNTER — Ambulatory Visit: Admitting: Gastroenterology

## 2023-11-25 DIAGNOSIS — Z13820 Encounter for screening for osteoporosis: Secondary | ICD-10-CM

## 2023-12-17 ENCOUNTER — Encounter: Payer: Self-pay | Admitting: Orthopedic Surgery

## 2023-12-17 ENCOUNTER — Other Ambulatory Visit: Payer: Self-pay

## 2023-12-17 ENCOUNTER — Other Ambulatory Visit (INDEPENDENT_AMBULATORY_CARE_PROVIDER_SITE_OTHER): Payer: Self-pay

## 2023-12-17 ENCOUNTER — Ambulatory Visit (INDEPENDENT_AMBULATORY_CARE_PROVIDER_SITE_OTHER): Admitting: Orthopedic Surgery

## 2023-12-17 DIAGNOSIS — S72141G Displaced intertrochanteric fracture of right femur, subsequent encounter for closed fracture with delayed healing: Secondary | ICD-10-CM | POA: Diagnosis not present

## 2023-12-17 DIAGNOSIS — S72141D Displaced intertrochanteric fracture of right femur, subsequent encounter for closed fracture with routine healing: Secondary | ICD-10-CM

## 2023-12-17 NOTE — Progress Notes (Signed)
 Orthopaedic Postop Note  Assessment: Sherry Burgess is a 73 y.o. female s/p cephalomedullary nail for Right intertrochanteric femur fracture  DOS: 11/06/2023  Plan: Mrs. Bumpus is doing very well.  She is ambulating using a walker.  She is not taking anything for pain.  Incisions have healed well.  Radiographs remain stable.  Encouraged her to continue working with therapy, and to walk to help strengthen the leg.  She states understanding.  Follow-up in 6 weeks   Follow-up: Return in about 6 weeks (around 01/28/2024). XR at next visit: AP pelvis and Right femur  Subjective:  Chief Complaint  Patient presents with   Routine Post Op    Right basicervical femoral neck fracture DOS 11/06/23    History of Present Illness: Sherry Burgess is a 73 y.o. female who presents following the above stated procedure.  Surgery was approximately 6 weeks ago.  She is working diligently with physical therapy.  She is not taking anything for pain.  She is using a walker to assist with ambulation.  No complaints at this time.  No issues with her incisions.   Review of Systems: No fevers or chills No numbness or tingling No Chest Pain No shortness of breath   Objective: There were no vitals taken for this visit.  Physical Exam:  Alert and oriented.  No acute distress.  Steady gait with a walker  Surgical incisions have healed.  No surrounding erythema or drainage.  No tenderness laterally.  She tolerates gentle range of motion of the right hip.  She is able to maintain a straight leg raise.  Active motion intact in EHL/TA.   IMAGING: I personally ordered and reviewed the following images:  AP pelvis and right femur x-rays were obtained in clinic today.  These are compared to prior x-rays.  Well-positioned cephalomedullary nail, without change in alignment.  There is no subsidence.  There has been interval consolidation.  Fracture is less visible.  Screws are not backing out.  No cutout of  the lag screw.  Impression: Stable right intertrochanteric femur fracture, without hardware failure or subsidence  Sherry DELENA Horde, MD 12/17/2023 9:17 AM

## 2024-01-23 ENCOUNTER — Ambulatory Visit (HOSPITAL_COMMUNITY)
Admission: RE | Admit: 2024-01-23 | Discharge: 2024-01-23 | Disposition: A | Source: Ambulatory Visit | Attending: Adult Health

## 2024-01-23 DIAGNOSIS — Z13820 Encounter for screening for osteoporosis: Secondary | ICD-10-CM

## 2024-01-28 ENCOUNTER — Other Ambulatory Visit

## 2024-01-28 ENCOUNTER — Ambulatory Visit: Admitting: Orthopedic Surgery

## 2024-01-28 ENCOUNTER — Encounter: Payer: Self-pay | Admitting: Orthopedic Surgery

## 2024-01-28 DIAGNOSIS — S72141D Displaced intertrochanteric fracture of right femur, subsequent encounter for closed fracture with routine healing: Secondary | ICD-10-CM

## 2024-01-28 NOTE — Progress Notes (Unsigned)
 Orthopaedic Postop Note  Assessment: LINZEY RAMSER is a 73 y.o. female s/p cephalomedullary nail for Right intertrochanteric femur fracture  DOS: 11/06/2023  Plan: Mrs. Kinnison is doing very well.  She is ambulating using a walker.  She is not taking anything for pain.  Incisions have healed well.  Radiographs remain stable.  Encouraged her to continue working with therapy, and to walk to help strengthen the leg.  She states understanding.  Follow-up in 6 weeks   Follow-up: No follow-ups on file. XR at next visit: AP pelvis and Right femur  Subjective:  Chief Complaint  Patient presents with   Routine Post Op    R Femur Fx DOS: 11/06/2023    History of Present Illness: LORIANA SAMAD is a 73 y.o. female who presents following the above stated procedure.  Surgery was approximately 6 weeks ago.  She is working diligently with physical therapy.  She is not taking anything for pain.  She is using a walker to assist with ambulation.  No complaints at this time.  No issues with her incisions.   Review of Systems: No fevers or chills No numbness or tingling No Chest Pain No shortness of breath   Objective: There were no vitals taken for this visit.  Physical Exam:  Alert and oriented.  No acute distress.  Steady gait with a walker  Surgical incisions have healed.  No surrounding erythema or drainage.  No tenderness laterally.  She tolerates gentle range of motion of the right hip.  She is able to maintain a straight leg raise.  Active motion intact in EHL/TA.   IMAGING: I personally ordered and reviewed the following images:  AP pelvis and right femur x-rays were obtained in clinic today.  These are compared to prior x-rays.  Well-positioned cephalomedullary nail, without change in alignment.  There is no subsidence.  There has been interval consolidation.  Fracture is less visible.  Screws are not backing out.  No cutout of the lag screw.  Impression: Stable right  intertrochanteric femur fracture, without hardware failure or subsidence  Oneil DELENA Horde, MD 01/28/2024 9:19 AM

## 2024-02-02 ENCOUNTER — Other Ambulatory Visit (HOSPITAL_COMMUNITY)
